# Patient Record
Sex: Male | Born: 1950 | Race: White | Hispanic: No | Smoking: Current every day smoker
Health system: Southern US, Community
[De-identification: ages and names within clinical notes are randomized; demographics above are authoritative.]

## PROBLEM LIST (undated history)

## (undated) DIAGNOSIS — J449 Chronic obstructive pulmonary disease, unspecified: Secondary | ICD-10-CM

## (undated) DIAGNOSIS — I2699 Other pulmonary embolism without acute cor pulmonale: Secondary | ICD-10-CM

## (undated) DIAGNOSIS — I10 Essential (primary) hypertension: Secondary | ICD-10-CM

## (undated) DIAGNOSIS — R0902 Hypoxemia: Secondary | ICD-10-CM

## (undated) DIAGNOSIS — I509 Heart failure, unspecified: Secondary | ICD-10-CM

## (undated) DIAGNOSIS — E669 Obesity, unspecified: Secondary | ICD-10-CM

## (undated) DIAGNOSIS — E785 Hyperlipidemia, unspecified: Secondary | ICD-10-CM

## (undated) DIAGNOSIS — J189 Pneumonia, unspecified organism: Secondary | ICD-10-CM

## (undated) DIAGNOSIS — I2510 Atherosclerotic heart disease of native coronary artery without angina pectoris: Secondary | ICD-10-CM

## (undated) DIAGNOSIS — M1990 Unspecified osteoarthritis, unspecified site: Secondary | ICD-10-CM

## (undated) DIAGNOSIS — Z86711 Personal history of pulmonary embolism: Secondary | ICD-10-CM

## (undated) DIAGNOSIS — I255 Ischemic cardiomyopathy: Secondary | ICD-10-CM

## (undated) DIAGNOSIS — K219 Gastro-esophageal reflux disease without esophagitis: Secondary | ICD-10-CM

## (undated) DIAGNOSIS — I219 Acute myocardial infarction, unspecified: Secondary | ICD-10-CM

## (undated) HISTORY — PX: CHOLECYSTECTOMY: SHX55

## (undated) HISTORY — DX: Hypoxemia: R09.02

## (undated) HISTORY — DX: Personal history of pulmonary embolism: Z86.711

## (undated) HISTORY — PX: ANKLE SURGERY: SHX546

## (undated) HISTORY — DX: Chronic obstructive pulmonary disease, unspecified: J44.9

## (undated) HISTORY — DX: Other pulmonary embolism without acute cor pulmonale: I26.99

## (undated) HISTORY — DX: Hyperlipidemia, unspecified: E78.5

## (undated) HISTORY — DX: Atherosclerotic heart disease of native coronary artery without angina pectoris: I25.10

## (undated) HISTORY — PX: APPENDECTOMY: SHX54

## (undated) HISTORY — DX: Obesity, unspecified: E66.9

## (undated) HISTORY — DX: Essential (primary) hypertension: I10

## (undated) HISTORY — DX: Ischemic cardiomyopathy: I25.5

## (undated) HISTORY — DX: Heart failure, unspecified: I50.9

## (undated) HISTORY — DX: Acute myocardial infarction, unspecified: I21.9

## (undated) HISTORY — DX: Gastro-esophageal reflux disease without esophagitis: K21.9

## (undated) HISTORY — DX: Unspecified osteoarthritis, unspecified site: M19.90

---

## 2003-03-24 DIAGNOSIS — I219 Acute myocardial infarction, unspecified: Secondary | ICD-10-CM

## 2003-03-24 HISTORY — DX: Acute myocardial infarction, unspecified: I21.9

## 2015-03-24 DIAGNOSIS — Z86711 Personal history of pulmonary embolism: Secondary | ICD-10-CM

## 2015-03-24 HISTORY — DX: Personal history of pulmonary embolism: Z86.711

## 2015-03-27 HISTORY — PX: CARDIAC CATHETERIZATION: SHX172

## 2015-03-29 HISTORY — PX: CORONARY ARTERY BYPASS GRAFT: SHX141

## 2015-03-31 DIAGNOSIS — E669 Obesity, unspecified: Secondary | ICD-10-CM | POA: Insufficient documentation

## 2015-04-10 DIAGNOSIS — I2699 Other pulmonary embolism without acute cor pulmonale: Secondary | ICD-10-CM

## 2015-04-10 HISTORY — DX: Other pulmonary embolism without acute cor pulmonale: I26.99

## 2015-08-01 DIAGNOSIS — E782 Mixed hyperlipidemia: Secondary | ICD-10-CM | POA: Insufficient documentation

## 2015-08-01 DIAGNOSIS — Z951 Presence of aortocoronary bypass graft: Secondary | ICD-10-CM | POA: Insufficient documentation

## 2016-05-21 DIAGNOSIS — I2510 Atherosclerotic heart disease of native coronary artery without angina pectoris: Secondary | ICD-10-CM | POA: Insufficient documentation

## 2016-05-21 DIAGNOSIS — I252 Old myocardial infarction: Secondary | ICD-10-CM | POA: Insufficient documentation

## 2016-05-22 DIAGNOSIS — I2510 Atherosclerotic heart disease of native coronary artery without angina pectoris: Secondary | ICD-10-CM | POA: Diagnosis not present

## 2016-05-22 DIAGNOSIS — E782 Mixed hyperlipidemia: Secondary | ICD-10-CM | POA: Diagnosis not present

## 2016-05-22 DIAGNOSIS — Z951 Presence of aortocoronary bypass graft: Secondary | ICD-10-CM | POA: Diagnosis not present

## 2016-05-22 DIAGNOSIS — I739 Peripheral vascular disease, unspecified: Secondary | ICD-10-CM | POA: Diagnosis not present

## 2016-05-22 DIAGNOSIS — I252 Old myocardial infarction: Secondary | ICD-10-CM | POA: Diagnosis not present

## 2016-05-22 DIAGNOSIS — I2782 Chronic pulmonary embolism: Secondary | ICD-10-CM | POA: Diagnosis not present

## 2016-05-22 DIAGNOSIS — I2699 Other pulmonary embolism without acute cor pulmonale: Secondary | ICD-10-CM | POA: Diagnosis not present

## 2016-06-16 DIAGNOSIS — R918 Other nonspecific abnormal finding of lung field: Secondary | ICD-10-CM | POA: Diagnosis not present

## 2016-06-16 DIAGNOSIS — I2699 Other pulmonary embolism without acute cor pulmonale: Secondary | ICD-10-CM | POA: Diagnosis not present

## 2016-06-16 DIAGNOSIS — I739 Peripheral vascular disease, unspecified: Secondary | ICD-10-CM | POA: Diagnosis not present

## 2016-08-11 DIAGNOSIS — R739 Hyperglycemia, unspecified: Secondary | ICD-10-CM | POA: Diagnosis not present

## 2016-08-11 DIAGNOSIS — E782 Mixed hyperlipidemia: Secondary | ICD-10-CM | POA: Diagnosis not present

## 2016-08-11 DIAGNOSIS — I2510 Atherosclerotic heart disease of native coronary artery without angina pectoris: Secondary | ICD-10-CM | POA: Diagnosis not present

## 2016-12-03 DIAGNOSIS — I252 Old myocardial infarction: Secondary | ICD-10-CM | POA: Diagnosis not present

## 2016-12-03 DIAGNOSIS — Z951 Presence of aortocoronary bypass graft: Secondary | ICD-10-CM | POA: Diagnosis not present

## 2016-12-03 DIAGNOSIS — I25110 Atherosclerotic heart disease of native coronary artery with unstable angina pectoris: Secondary | ICD-10-CM | POA: Diagnosis not present

## 2016-12-03 DIAGNOSIS — E782 Mixed hyperlipidemia: Secondary | ICD-10-CM | POA: Diagnosis not present

## 2016-12-03 DIAGNOSIS — I2782 Chronic pulmonary embolism: Secondary | ICD-10-CM | POA: Diagnosis not present

## 2016-12-03 DIAGNOSIS — I2510 Atherosclerotic heart disease of native coronary artery without angina pectoris: Secondary | ICD-10-CM | POA: Diagnosis not present

## 2017-03-03 ENCOUNTER — Other Ambulatory Visit: Payer: Self-pay | Admitting: *Deleted

## 2017-03-03 ENCOUNTER — Encounter: Payer: Self-pay | Admitting: *Deleted

## 2017-03-04 ENCOUNTER — Encounter: Payer: Self-pay | Admitting: Cardiovascular Disease

## 2017-03-04 ENCOUNTER — Ambulatory Visit (INDEPENDENT_AMBULATORY_CARE_PROVIDER_SITE_OTHER): Payer: Medicare Other | Admitting: Cardiovascular Disease

## 2017-03-04 ENCOUNTER — Other Ambulatory Visit: Payer: Self-pay

## 2017-03-04 VITALS — BP 116/74 | HR 65 | Ht 66.0 in | Wt 255.0 lb

## 2017-03-04 DIAGNOSIS — Z720 Tobacco use: Secondary | ICD-10-CM

## 2017-03-04 DIAGNOSIS — Z86711 Personal history of pulmonary embolism: Secondary | ICD-10-CM | POA: Diagnosis not present

## 2017-03-04 DIAGNOSIS — E785 Hyperlipidemia, unspecified: Secondary | ICD-10-CM | POA: Diagnosis not present

## 2017-03-04 DIAGNOSIS — I25708 Atherosclerosis of coronary artery bypass graft(s), unspecified, with other forms of angina pectoris: Secondary | ICD-10-CM | POA: Diagnosis not present

## 2017-03-04 DIAGNOSIS — I10 Essential (primary) hypertension: Secondary | ICD-10-CM | POA: Diagnosis not present

## 2017-03-04 MED ORDER — NITROGLYCERIN 0.4 MG SL SUBL: 0.4000 mg | SUBLINGUAL_TABLET | SUBLINGUAL | 3 refills | Status: AC | PRN

## 2017-03-04 NOTE — Progress Notes (Signed)
CARDIOLOGY CONSULT NOTE  Patient ID: AVIRAJ KENTNER MRN: [id number] DOB/AGE: 1950/03/28 66 y.o.  Admit date: (Not on file) Primary Physician: Patient, No Pcp Per Referring Physician: Dr. Jac Canavan  Reason for Consultation: Coronary artery disease  HPI: Jose Keith is a 66 y.o. male who is being seen today for the evaluation of coronary artery disease at the request of Dr. Jac Canavan.  He has a history of coronary artery bypass graft surgery in January 2017 with a LIMA to the LAD, SVG to the obtuse marginal branch and PDA.  Additional past medical history includes diabetes mellitus, dyslipidemia, obesity, pulmonary embolism and genera 2017, and history of old myocardial infarction in 2005.  He has a history of tobacco abuse.  I reviewed all relevant documentation, labs, and studies from his prior cardiologist.  Echocardiogram in January 2017 demonstrated mildly reduced left ventricular systolic function, LVEF 41-66%, with distal septal and apical dyskinesis.  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  Lipids 09/10/16: Total cholesterol 120, HDL 32, LDL 60, triglycerides 140.  He has been maintained on aspirin, Lipitor, and carvedilol.  Labs 10/11/16: BUN 14, creatinine 0.87, sodium 139, potassium 4.9.  ECG performed in the office today which I personally interpreted demonstrated sinus rhythm with right bundle branch block, left anterior fascicular block, and old anterolateral infarct.  He is feeling well and denies chest pain.  He has chronic exertional dyspnea which has not gotten any worse which he attributes to long-term tobacco use.  He has smoked three quarters of a pack of cigarettes daily since the age of 52.  He has some right knee arthritis.  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   He denies exertional leg pain.  He is here with his  daughter, Earnest Bailey, who lives with him.  He moved to [name] from [place] recently.  He used to be a Administrator, [employer] in [place].   No Known Allergies  Current Outpatient Medications  Medication Sig Dispense Refill  . Acetaminophen 325 MG CAPS Take by mouth as needed.    Marland Kitchen aspirin EC 325 MG tablet Take 1 tablet by mouth daily.    Marland Kitchen atorvastatin (LIPITOR) 40 MG tablet Take 1 tablet by mouth daily.    . carvedilol (COREG) 6.25 MG tablet Take 1 tablet by mouth 2 (two) times daily.    . ranitidine (ZANTAC) 150 MG tablet Take 150 mg by mouth 2 (two) times daily.     No current facility-administered medications for this visit.     Past Medical History:  Diagnosis Date  . CAD (coronary artery disease)   . Diabetes mellitus ([place])   . Heart attack ([place]) 2005   Old MI reported 2005  . History of pulmonary embolism 03/2015  . Hyperlipidemia   . Ischemic cardiomyopathy   . Obesity     Past Surgical History:  Procedure Laterality Date  . ANKLE SURGERY Left   . APPENDECTOMY    . CARDIAC CATHETERIZATION  03/27/2015  . CHOLECYSTECTOMY    . CORONARY ARTERY BYPASS GRAFT  03/29/2015   Coronary bypass surgery with LIMA-LAD, SVG-obutse marginal branch/PDA    Social History   Socioeconomic History  . Marital status: Divorced    Spouse name: Not on file  . Number of children: Not on file  . Years of education: Not on file  . Highest education level: Not on  file  Social Needs  . Financial resource strain: Not on file  . Food insecurity - worry: Not on file  . Food insecurity - inability: Not on file  . Transportation needs - medical: Not on file  . Transportation needs - non-medical: Not on file  Occupational History  . Not on file  Tobacco Use  . Smoking status: Current Every Day Smoker    Packs/day: 0.75    Types: Cigarettes  . Smokeless tobacco: Never Used  Substance and Sexual Activity  . Alcohol use: Not on file  . Drug use: Not on file  . Sexual activity: Not on file  Other  Topics Concern  . Not on file  Social History Narrative  . Not on file     No family history of premature CAD in 1st degree relatives.  Current Meds  Medication Sig  . Acetaminophen 325 MG CAPS Take by mouth as needed.  Marland Kitchen aspirin EC 325 MG tablet Take 1 tablet by mouth daily.  Marland Kitchen atorvastatin (LIPITOR) 40 MG tablet Take 1 tablet by mouth daily.  . carvedilol (COREG) 6.25 MG tablet Take 1 tablet by mouth 2 (two) times daily.  . ranitidine (ZANTAC) 150 MG tablet Take 150 mg by mouth 2 (two) times daily.  . [DISCONTINUED] omeprazole (PRILOSEC) 40 MG capsule Take 1 capsule by mouth daily.      Review of systems complete and found to be negative unless listed above in HPI    Physical exam Blood pressure 116/74, pulse 65, height 5\' 6"  (1.676 m), weight 255 lb (115.7 kg), SpO2 96 %. General: NAD Neck: No JVD, no thyromegaly or thyroid nodule.  Lungs: Clear to auscultation bilaterally with normal respiratory effort. CV: Nondisplaced PMI. Regular rate and rhythm, normal S1/S2, no S3/S4, no murmur.  No peripheral edema.  No carotid bruit.    Abdomen: Soft, nontender, no distention.  Skin: Intact without lesions or rashes.  Neurologic: Alert and oriented x 3.  Psych: Normal affect. Extremities: No clubbing or cyanosis.  HEENT: Normal.   ECG: Most recent ECG reviewed.   Labs: No results found for: K, BUN, CREATININE, ALT, TSH, HGB   Lipids: No results found for: LDLCALC, LDLDIRECT, CHOL, TRIG, HDL      ASSESSMENT AND PLAN:  1.  Coronary artery disease with history of CABG: Symptomatically stable.  Continue aspirin, carvedilol, and Lipitor.  I will prescribe nitroglycerin to be used as needed.  2.  Hypertension: Blood pressure is controlled.  No changes to therapy.  3.  Hyperlipidemia: Lipids reviewed above.  Continue statin therapy.  4.  Tobacco abuse disorder.  5.  History of pulmonary embolism: No longer on any coagulation.  He has been on Xarelto for 1 year.  He was  advised to take aspirin 325 mg daily at the time of hospital discharge.    Disposition: Follow up in 6 months  Signed: Kate Sable, M.D., F.A.C.C.  03/04/2017, 10:36 AM

## 2017-03-04 NOTE — Patient Instructions (Signed)

## 2017-03-29 ENCOUNTER — Ambulatory Visit: Payer: Medicare Other | Admitting: Family Medicine

## 2017-04-01 ENCOUNTER — Other Ambulatory Visit: Payer: Self-pay

## 2017-04-01 ENCOUNTER — Ambulatory Visit (INDEPENDENT_AMBULATORY_CARE_PROVIDER_SITE_OTHER): Payer: Medicare Other | Admitting: Family Medicine

## 2017-04-01 ENCOUNTER — Encounter: Payer: Self-pay | Admitting: Family Medicine

## 2017-04-01 VITALS — BP 138/76 | HR 60 | Temp 98.8°F | Resp 20 | Ht 66.0 in | Wt 251.0 lb

## 2017-04-01 DIAGNOSIS — K219 Gastro-esophageal reflux disease without esophagitis: Secondary | ICD-10-CM | POA: Diagnosis not present

## 2017-04-01 DIAGNOSIS — R351 Nocturia: Secondary | ICD-10-CM | POA: Diagnosis not present

## 2017-04-01 DIAGNOSIS — Z125 Encounter for screening for malignant neoplasm of prostate: Secondary | ICD-10-CM | POA: Diagnosis not present

## 2017-04-01 DIAGNOSIS — Z800 Family history of malignant neoplasm of digestive organs: Secondary | ICD-10-CM | POA: Insufficient documentation

## 2017-04-01 DIAGNOSIS — I2699 Other pulmonary embolism without acute cor pulmonale: Secondary | ICD-10-CM

## 2017-04-01 DIAGNOSIS — Z23 Encounter for immunization: Secondary | ICD-10-CM

## 2017-04-01 DIAGNOSIS — I10 Essential (primary) hypertension: Secondary | ICD-10-CM | POA: Diagnosis not present

## 2017-04-01 DIAGNOSIS — Z1159 Encounter for screening for other viral diseases: Secondary | ICD-10-CM | POA: Diagnosis not present

## 2017-04-01 DIAGNOSIS — E559 Vitamin D deficiency, unspecified: Secondary | ICD-10-CM | POA: Diagnosis not present

## 2017-04-01 DIAGNOSIS — Z720 Tobacco use: Secondary | ICD-10-CM | POA: Diagnosis not present

## 2017-04-01 DIAGNOSIS — E785 Hyperlipidemia, unspecified: Secondary | ICD-10-CM

## 2017-04-01 DIAGNOSIS — N401 Enlarged prostate with lower urinary tract symptoms: Secondary | ICD-10-CM | POA: Diagnosis not present

## 2017-04-01 DIAGNOSIS — I2510 Atherosclerotic heart disease of native coronary artery without angina pectoris: Secondary | ICD-10-CM

## 2017-04-01 NOTE — Patient Instructions (Addendum)
Walk every day that you are able Please give serious consideration to stopping smoking Continue current medicines Need lab tests I recommend immunizations Come back for PE     Preventive Care 65 Years and Older, Male Preventive care refers to lifestyle choices and visits with your health care provider that can promote health and wellness. What does preventive care include?  A yearly physical exam. This is also called an annual well check.  Dental exams once or twice a year.  Routine eye exams. Ask your health care provider how often you should have your eyes checked.  Personal lifestyle choices, including: ? Daily care of your teeth and gums. ? Regular physical activity. ? Eating a healthy diet. ? Avoiding tobacco and drug use. ? Limiting alcohol use. ? Practicing safe sex. ? Taking low doses of aspirin every day. ? Taking vitamin and mineral supplements as recommended by your health care provider. What happens during an annual well check? The services and screenings done by your health care provider during your annual well check will depend on your age, overall health, lifestyle risk factors, and family history of disease. Counseling Your health care provider may ask you questions about your:  Alcohol use.  Tobacco use.  Drug use.  Emotional well-being.  Home and relationship well-being.  Sexual activity.  Eating habits.  History of falls.  Memory and ability to understand (cognition).  Work and work Statistician.  Screening You may have the following tests or measurements:  Height, weight, and BMI.  Blood pressure.  Lipid and cholesterol levels. These may be checked every 5 years, or more frequently if you are over 64 years old.  Skin check.  Lung cancer screening. You may have this screening every year starting at age 43 if you have a 30-pack-year history of smoking and currently smoke or have quit within the past 15 years..  Flexible sigmoidoscopy  or colonoscopy. You may have a sigmoidoscopy every 5 years or a colonoscopy every 10 years starting at age 24. An alterative is the cologuard home test  Prostate cancer screening. Recommendations will vary depending on your family history and other risks.  Hepatitis C blood test.  Diabetes screening. This is done by checking your blood sugar (glucose) after you have not eaten for a while (fasting). You may have this done every 1-3 years.  Abdominal aortic aneurysm (AAA) screening. You may need this if you are a current or former smoker.   Talk with your health care provider about your test results, treatment options, and if necessary, the need for more tests. Vaccines Your health care provider may recommend certain vaccines, such as:  Influenza vaccine. This is recommended every year.  Tetanus, diphtheria, and acellular pertussis (Tdap, Td) vaccine. You may need a Td booster every 10 years.  Varicella vaccine. You may need this if you have not been vaccinated.  Zoster vaccine. You may need this after age 64.  also need a second dose.  Pneumococcal 13-valent conjugate (PCV13) vaccine. One dose is recommended after age 63.  Pneumococcal polysaccharide (PPSV23) vaccine. One dose is recommended after age 47.

## 2017-04-01 NOTE — Progress Notes (Signed)
Chief Complaint  Patient presents with  . Hypertension   67 year old new patient here with his daughter to establish for primary care.  He states he has "never" had a primary care doctor.  He is under the care of cardiology for known coronary artery disease, status post CABG, hypertension, hyperlipidemia, tobacco abuse. He has not up-to-date with a preventative health care.  No recall of immunizations.  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  Refuses flu shot, pneumonia shot, and shingles shot.  The reasons behind benefits and risks of all the shots were discussed with him today. He is never had a colonoscopy.  His brother is currently dying of colorectal cancer.  He is 67 years of age.  I explained that he is at increased risk of colon cancer because of his tobacco abuse and his family history.  He states he does not want a colonoscopy. He also is due for screening for abdominal aortic aneurysm. He has not had a physical examination in many years. He has not had an eye examination many years. He no longer sees a dentist, has no teeth.  He states he pulled most of his own teeth out He gets no regular exercise. He has a history of COPD.  He has dyspnea with exertion. He denies any angina.  He states he has nitro, he carries it with him, he has never used it. I have discussed the multiple health risks associated with cigarette smoking including, but not limited to, cardiovascular disease, lung disease and cancer.  I have strongly recommended that smoking be stopped.  I have reviewed the various methods of quitting including cold [place], classes, nicotine replacements and prescription medications.  I have offered assistance in this difficult process.  The patient is not interested in assistance at this time. His only complaint is left knee pain.  His right knee bothers him with exertion.  He thinks he has arthritis.  He takes over-the-counter Tylenol or Aleve for his knee pain.  No  trauma.  No buckling or instability.  No falls.   Patient Active Problem List   Diagnosis Date Noted  . BPH associated with nocturia 04/01/2017  . Essential hypertension 04/01/2017  . Tobacco abuse 04/01/2017  . Family history of colon cancer 04/01/2017  . GERD without esophagitis 04/01/2017  . Coronary artery disease involving native coronary artery of native heart without angina pectoris 05/21/2016  . Old MI (myocardial infarction) 05/21/2016  . Mixed hyperlipidemia 08/01/2015  . S/P CABG x 3 08/01/2015  . Obesity (BMI 30-39.9) 03/31/2015    Outpatient Encounter Medications as of 04/01/2017  Medication Sig  . Acetaminophen 325 MG CAPS Take by mouth as needed.  Marland Kitchen aspirin EC 325 MG tablet Take 1 tablet by mouth daily.  Marland Kitchen atorvastatin (LIPITOR) 40 MG tablet Take 1 tablet by mouth daily.  . carvedilol (COREG) 6.25 MG tablet Take 1 tablet by mouth 2 (two) times daily.  . naproxen sodium (ALEVE) 220 MG tablet Take 220 mg by mouth 2 (two) times daily as needed.  . nitroGLYCERIN (NITROSTAT) 0.4 MG SL tablet Place 1 tablet (0.4 mg total) under the tongue every 5 (five) minutes as needed for chest pain.  . ranitidine (ZANTAC) 150 MG tablet Take 150 mg by mouth at bedtime.    No facility-administered encounter medications on file as of 04/01/2017.     Past Medical History:  Diagnosis Date  . Arthritis    knee  . CAD (coronary artery disease)   .  CHF (congestive heart failure) ([place])   . COPD (chronic obstructive pulmonary disease) ([place])   . GERD (gastroesophageal reflux disease)   . Heart attack ([place]) 2005   Old MI reported 2005  . History of pulmonary embolism 03/2015  . Hyperlipidemia   . Hypertension   . Ischemic cardiomyopathy   . Obesity   . Oxygen deficiency   . Pulmonary embolism ([place]) 04/10/2015    Past Surgical History:  Procedure Laterality Date  . ANKLE SURGERY Left   . APPENDECTOMY    . CARDIAC CATHETERIZATION  03/27/2015  . CHOLECYSTECTOMY    . CORONARY ARTERY  BYPASS GRAFT  03/29/2015   Coronary bypass surgery with LIMA-LAD, SVG-obutse marginal branch/PDA    Social History   Socioeconomic History  . Marital status: Divorced    Spouse name: Not on file  . Number of children: 2  . Years of education: 10  . Highest education level: 10th grade  Social Needs  . Financial resource strain: Not on file  . Food insecurity - worry: Not on file  . Food insecurity - inability: Not on file  . Transportation needs - medical: Not on file  . Transportation needs - non-medical: Not on file  Occupational History  . Occupation: Administrator, [employer]   retired  Tobacco Use  . Smoking status: Current Every Day Smoker    Packs/day: 0.75    Types: Cigarettes    Start date: 03/23/1966  . Smokeless tobacco: Never Used  Substance and Sexual Activity  . Alcohol use: No    Frequency: Never  . Drug use: No  . Sexual activity: No  Other Topics Concern  . Not on file  Social History Narrative   Lives with Daughter Earnest Bailey   Pets   3 dogs   9 birds   No reg exercise    Family History  Problem Relation Age of Onset  . Stroke Mother   . Depression Mother   . Stroke Father   . Stroke Brother   . Heart attack Brother   . Cancer Brother        rectal/colon  . Asthma Son   . Stroke Brother     Review of Systems  Constitutional: Negative for chills, fever and weight loss.  HENT: Positive for hearing loss. Negative for congestion.        States he cannot hear out of right ear  Eyes: Negative for blurred vision and pain.  Respiratory: Positive for shortness of breath. Negative for cough.   Cardiovascular: Negative for chest pain and leg swelling.  Gastrointestinal: Negative for abdominal pain, constipation, diarrhea and heartburn.  Genitourinary: Negative for dysuria and frequency.  Musculoskeletal: Positive for joint pain. Negative for falls and myalgias.  Neurological: Negative for dizziness, seizures and headaches.  Psychiatric/Behavioral: Negative for  depression. The patient is not nervous/anxious and does not have insomnia.     BP 138/76 (BP Location: Right Arm, Patient Position: Sitting, Cuff Size: Large)   Pulse 60   Temp 98.8 F (37.1 C) (Temporal)   Resp 20   Ht 5\' 6"  (1.676 m)   Wt 251 lb 0.6 oz (113.9 kg)   SpO2 93%   BMI 40.52 kg/m   Physical Exam  Constitutional: He is oriented to person, place, and time. He appears well-developed and well-nourished.  HENT:  Head: Normocephalic and atraumatic.  Mouth/Throat: Oropharynx is clear and moist.  Edentulous  Eyes: Conjunctivae are normal. Pupils are equal, round, and reactive to light.  Neck: Normal range of  motion. Neck supple. No thyromegaly present.  Cardiovascular: Normal rate, regular rhythm and normal heart sounds.  Pulmonary/Chest: Effort normal and breath sounds normal. No respiratory distress.  Abdominal: Soft. Bowel sounds are normal.  Obese abdomen  Musculoskeletal: Normal range of motion. He exhibits no edema.  Crepitus and warmth right knee  Lymphadenopathy:    He has no cervical adenopathy.  Neurological: He is alert and oriented to person, place, and time.  Gait minimally antalgic  Skin: Skin is warm and dry.  Psychiatric: He has a normal mood and affect. His behavior is normal. Thought content normal.  Nursing note and vitals reviewed.  ASSESSMENT/PLAN:  1. Coronary artery disease involving native heart without angina pectoris, unspecified vessel or lesion type Under care of cardiology.  Denies angina - CBC - COMPLETE METABOLIC PANEL WITH GFR - Urinalysis, Routine w reflex microscopic  2. Essential hypertension Controlled  3. Hyperlipidemia, unspecified hyperlipidemia type Compliant with statin, needs lipid panel - Lipid panel  4. Tobacco abuse Unwilling to quit  5. Encounter for hepatitis C screening test for low risk patient Discussed - Hepatitis C antibody  6. Screening for prostate cancer Patient desires blood test for prostate  cancer.  Discussed risks and benefits. - PSA  7. Family history of colon cancer Refuses colonoscopy  8. Vitamin D deficiency By history - VITAMIN D 25 Hydroxy (Vit-D Deficiency, Fractures)  9. BPH associated with nocturia Nocturia x2 - PSA  10. Need for influenza vaccination Refuses - Flu Vaccine QUAD 36+ mos IM  11. Other pulmonary embolism without acute cor pulmonale, unspecified chronicity ([place]) History of  12. GERD without esophagitis Controlled with Zantac   Patient Instructions  Walk every day that you are able Please give serious consideration to stopping smoking Continue current medicines Need lab tests I recommend immunizations Come back for PE     Preventive Care 65 Years and Older, Male Preventive care refers to lifestyle choices and visits with your health care provider that can promote health and wellness. What does preventive care include?  A yearly physical exam. This is also called an annual well check.  Dental exams once or twice a year.  Routine eye exams. Ask your health care provider how often you should have your eyes checked.  Personal lifestyle choices, including: ? Daily care of your teeth and gums. ? Regular physical activity. ? Eating a healthy diet. ? Avoiding tobacco and drug use. ? Limiting alcohol use. ? Practicing safe sex. ? Taking low doses of aspirin every day. ? Taking vitamin and mineral supplements as recommended by your health care provider. What happens during an annual well check? The services and screenings done by your health care provider during your annual well check will depend on your age, overall health, lifestyle risk factors, and family history of disease. Counseling Your health care provider may ask you questions about your:  Alcohol use.  Tobacco use.  Drug use.  Emotional well-being.  Home and relationship well-being.  Sexual activity.  Eating habits.  History of falls.  Memory and ability  to understand (cognition).  Work and work Statistician.  Screening You may have the following tests or measurements:  Height, weight, and BMI.  Blood pressure.  Lipid and cholesterol levels. These may be checked every 5 years, or more frequently if you are over 35 years old.  Skin check.  Lung cancer screening. You may have this screening every year starting at age 37 if you have a 30-pack-year history of smoking and currently  smoke or have quit within the past 15 years..  Flexible sigmoidoscopy or colonoscopy. You may have a sigmoidoscopy every 5 years or a colonoscopy every 10 years starting at age 55. An alterative is the cologuard home test  Prostate cancer screening. Recommendations will vary depending on your family history and other risks.  Hepatitis C blood test.  Diabetes screening. This is done by checking your blood sugar (glucose) after you have not eaten for a while (fasting). You may have this done every 1-3 years.  Abdominal aortic aneurysm (AAA) screening. You may need this if you are a current or former smoker.   Talk with your health care provider about your test results, treatment options, and if necessary, the need for more tests. Vaccines Your health care provider may recommend certain vaccines, such as:  Influenza vaccine. This is recommended every year.  Tetanus, diphtheria, and acellular pertussis (Tdap, Td) vaccine. You may need a Td booster every 10 years.  Varicella vaccine. You may need this if you have not been vaccinated.  Zoster vaccine. You may need this after age 47.  also need a second dose.  Pneumococcal 13-valent conjugate (PCV13) vaccine. One dose is recommended after age 56.  Pneumococcal polysaccharide (PPSV23) vaccine. One dose is recommended after age 87.         Raylene Everts, MD

## 2017-04-02 ENCOUNTER — Encounter: Payer: Self-pay | Admitting: Family Medicine

## 2017-04-02 LAB — LIPID PANEL
Cholesterol: 111 mg/dL (ref <200)
HDL: 29 mg/dL — ABNORMAL LOW (ref >40)
LDL Cholesterol (Calc): 59 mg/dL (calc)
Non-HDL Cholesterol (Calc): 82 mg/dL (calc) (ref <130)
Total CHOL/HDL Ratio: 3.8 (calc) (ref <5.0)
Triglycerides: 152 mg/dL — ABNORMAL HIGH (ref <150)

## 2017-04-02 LAB — URINALYSIS, ROUTINE W REFLEX MICROSCOPIC
Bilirubin Urine: NEGATIVE
Glucose, UA: NEGATIVE
Hgb urine dipstick: NEGATIVE
Ketones, ur: NEGATIVE
Leukocytes, UA: NEGATIVE
Nitrite: NEGATIVE
Protein, ur: NEGATIVE
Specific Gravity, Urine: 1.011 (ref 1.001–1.03)
pH: 6.5 (ref 5.0–8.0)

## 2017-04-02 LAB — COMPLETE METABOLIC PANEL WITH GFR
AG Ratio: 1.3 (calc) (ref 1.0–2.5)
ALT: 19 U/L (ref 9–46)
AST: 15 U/L (ref 10–35)
Albumin: 4.1 g/dL (ref 3.6–5.1)
Alkaline phosphatase (APISO): 94 U/L (ref 40–115)
BUN: 12 mg/dL (ref 7–25)
CO2: 30 mmol/L (ref 20–32)
Calcium: 9.5 mg/dL (ref 8.6–10.3)
Chloride: 103 mmol/L (ref 98–110)
Creat: 1.02 mg/dL (ref 0.70–1.25)
GFR, Est African American: 89 mL/min/{1.73_m2} (ref >=60)
GFR, Est Non African American: 77 mL/min/{1.73_m2} (ref >=60)
Globulin: 3.2 g/dL (calc) (ref 1.9–3.7)
Glucose, Bld: 100 mg/dL (ref 65–139)
Potassium: 5.7 mmol/L — ABNORMAL HIGH (ref 3.5–5.3)
Sodium: 138 mmol/L (ref 135–146)
Total Bilirubin: 0.5 mg/dL (ref 0.2–1.2)
Total Protein: 7.3 g/dL (ref 6.1–8.1)

## 2017-04-02 LAB — CBC
HCT: 46.8 % (ref 38.5–50.0)
Hemoglobin: 16 g/dL (ref 13.2–17.1)
MCH: 31.2 pg (ref 27.0–33.0)
MCHC: 34.2 g/dL (ref 32.0–36.0)
MCV: 91.2 fL (ref 80.0–100.0)
MPV: 11.4 fL (ref 7.5–12.5)
Platelets: 232 10*3/uL (ref 140–400)
RBC: 5.13 10*6/uL (ref 4.20–5.80)
RDW: 12.7 % (ref 11.0–15.0)
WBC: 6.8 10*3/uL (ref 3.8–10.8)

## 2017-04-02 LAB — HEPATITIS C ANTIBODY
Hepatitis C Ab: NONREACTIVE
SIGNAL TO CUT-OFF: 0.02 (ref <1.00)

## 2017-04-02 LAB — VITAMIN D 25 HYDROXY (VIT D DEFICIENCY, FRACTURES): Vit D, 25-Hydroxy: 17 ng/mL — ABNORMAL LOW (ref 30–100)

## 2017-04-02 LAB — PSA: PSA: 0.3 ng/mL (ref <=4.0)

## 2017-04-29 ENCOUNTER — Encounter: Payer: Medicare Other | Admitting: Family Medicine

## 2017-05-31 ENCOUNTER — Encounter: Payer: Self-pay | Admitting: Family Medicine

## 2017-10-14 ENCOUNTER — Other Ambulatory Visit: Payer: Self-pay | Admitting: Cardiovascular Disease

## 2017-10-14 MED ORDER — CARVEDILOL 6.25 MG PO TABS: 6.2500 mg | ORAL_TABLET | Freq: Two times a day (BID) | ORAL | 0 refills | Status: DC

## 2017-10-14 MED ORDER — ATORVASTATIN CALCIUM 40 MG PO TABS: 40.0000 mg | ORAL_TABLET | Freq: Every day | ORAL | 0 refills | Status: DC

## 2017-10-14 NOTE — Telephone Encounter (Signed)
°*  STAT* If patient is at the pharmacy, call can be transferred to refill team.   1. Which medications need to be refilled?  atorvastatin (LIPITOR) 40 MG tablet  carvedilol (COREG) 6.25 MG tablet       2. Which pharmacy/location (including street and city if local pharmacy) is medication to be sent to? [facility]   3. Do they need a 30 day or 90 day supply?

## 2017-10-14 NOTE — Telephone Encounter (Signed)
RX sent

## 2017-12-28 ENCOUNTER — Ambulatory Visit (INDEPENDENT_AMBULATORY_CARE_PROVIDER_SITE_OTHER): Payer: Medicare Other | Admitting: Cardiovascular Disease

## 2017-12-28 ENCOUNTER — Encounter: Payer: Self-pay | Admitting: Cardiovascular Disease

## 2017-12-28 VITALS — BP 128/74 | HR 53 | Ht 69.0 in | Wt 249.0 lb

## 2017-12-28 DIAGNOSIS — I25708 Atherosclerosis of coronary artery bypass graft(s), unspecified, with other forms of angina pectoris: Secondary | ICD-10-CM

## 2017-12-28 DIAGNOSIS — M545 Low back pain, unspecified: Secondary | ICD-10-CM

## 2017-12-28 DIAGNOSIS — I10 Essential (primary) hypertension: Secondary | ICD-10-CM | POA: Diagnosis not present

## 2017-12-28 DIAGNOSIS — R600 Localized edema: Secondary | ICD-10-CM

## 2017-12-28 DIAGNOSIS — E785 Hyperlipidemia, unspecified: Secondary | ICD-10-CM

## 2017-12-28 DIAGNOSIS — Z720 Tobacco use: Secondary | ICD-10-CM | POA: Diagnosis not present

## 2017-12-28 NOTE — Progress Notes (Signed)
SUBJECTIVE: The patient presents for routine follow-up.  He has a history of coronary artery bypass graft surgery in January 2017 with a LIMA to the LAD, SVG to the obtuse marginal branch and PDA.    Echocardiogram in January 2017 demonstrated mildly reduced left ventricular systolic function, LVEF 65-99%, with distal septal and apical dyskinesis.  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  He has chronic exertional dyspnea which is stable.  He has a long history of tobacco use.  He is here with his daughter, Earnest Bailey, who lives with him.  He denies chest pain.  His primary complaints relate to lower back pain which has been going on for the past 6 months as well as right leg swelling.  His leg has felt warm to the touch.  He subjectively denies fevers.    He has a prior history of pulmonary embolism.  If he is sitting he is okay but if he stands for some time, he develops lower back pain and then numbness and weakness of both legs.  He has not seen his PCP in at least a year.  His daughter says he does not like going to see doctors.   Social history: He moved to [place] from [place].  He used to be a Administrator, [employer] in [place].  He lives with his daughter, Earnest Bailey.   Review of Systems: As per "subjective", otherwise negative.  No Known Allergies  Current Outpatient Medications  Medication Sig Dispense Refill  . acetaminophen (TYLENOL) 500 MG tablet Take 500 mg by mouth every 6 (six) hours as needed.    Marland Kitchen aspirin EC 325 MG tablet Take 1 tablet by mouth daily.    Marland Kitchen atorvastatin (LIPITOR) 40 MG tablet Take 1 tablet (40 mg total) by mouth daily. 90 tablet 0  . carvedilol (COREG) 6.25 MG tablet Take 1 tablet (6.25 mg total) by mouth 2 (two) times daily. 180 tablet 0  . naproxen sodium (ALEVE) 220 MG tablet Take 220 mg by mouth 2 (two) times daily as needed.     . ranitidine (ZANTAC) 150 MG tablet Take 150 mg by mouth at bedtime.     . nitroGLYCERIN (NITROSTAT) 0.4 MG SL tablet Place 1 tablet (0.4 mg total) under the tongue every 5 (five) minutes as needed for chest pain. 25 tablet 3   No current facility-administered medications for this visit.     Past Medical History:  Diagnosis Date  . Arthritis    knee  . CAD (coronary artery disease)   . CHF (congestive heart failure) ([place])   . COPD (chronic obstructive pulmonary disease) ([place])   . GERD (gastroesophageal reflux disease)   . Heart attack ([place]) 2005   Old MI reported 2005  . History of pulmonary embolism 03/2015  . Hyperlipidemia   . Hypertension   . Ischemic cardiomyopathy   . Obesity   . Oxygen deficiency   . Pulmonary embolism ([place]) 04/10/2015    Past Surgical History:  Procedure Laterality Date  . ANKLE SURGERY Left   . APPENDECTOMY    . CARDIAC CATHETERIZATION  03/27/2015  . CHOLECYSTECTOMY    . CORONARY ARTERY BYPASS GRAFT  03/29/2015   Coronary bypass surgery with LIMA-LAD, SVG-obutse marginal branch/PDA    Social History   Socioeconomic History  . Marital status: Divorced    Spouse name: Not on file  . Number of children: 2  .  Years of education: 71  . Highest education level: 10th grade  Occupational History  . Occupation: Administrator, [employer] Service   retired  Scientific laboratory technician  . Financial resource strain: Not on file  . Food insecurity:    Worry: Not on file    Inability: Not on file  . Transportation needs:    Medical: Not on file    Non-medical: Not on file  Tobacco Use  . Smoking status: Current Every Day Smoker    Packs/day: 0.75    Types: Cigarettes    Start date: 03/23/1966  . Smokeless tobacco: Never Used  Substance and Sexual Activity  . Alcohol use: No    Frequency: Never  . Drug use: No  . Sexual activity: Never  Lifestyle  . Physical activity:    Days per week: Not on file    Minutes per session: Not on file  . Stress: Not on file  Relationships  .  Social connections:    Talks on phone: Not on file    Gets together: Not on file    Attends religious service: Not on file    Active member of club or organization: Not on file    Attends meetings of clubs or organizations: Not on file    Relationship status: Not on file  . Intimate partner violence:    Fear of current or ex partner: Not on file    Emotionally abused: Not on file    Physically abused: Not on file    Forced sexual activity: Not on file  Other Topics Concern  . Not on file  Social History Narrative   Lives with Daughter Earnest Bailey   Pets   3 dogs   9 birds   No reg exercise     Vitals:   12/28/17 1619  BP: 128/74  Pulse: (!) 53  SpO2: 95%  Weight: 249 lb (112.9 kg)  Height: 5\' 9"  (1.753 m)    Wt Readings from Last 3 Encounters:  12/28/17 249 lb (112.9 kg)  04/01/17 251 lb 0.6 oz (113.9 kg)  03/04/17 255 lb (115.7 kg)     PHYSICAL EXAM General: NAD HEENT: Normal. Neck: No JVD, no thyromegaly. Lungs: Clear to auscultation bilaterally with normal respiratory effort. CV: Regular rate and rhythm, normal S1/S2, no S3/S4, no murmur.  Right leg is swollen and warm without erythema.  No pitting edema.  No carotid bruit.   Abdomen: Soft, nontender, obese.  Neurologic: Alert and oriented.  Psych: Normal affect. Skin: Normal. Musculoskeletal: No gross deformities.  No point tenderness of the spine.    ECG: Reviewed above under Subjective   Labs: Lab Results  Component Value Date/Time   K 5.7 (H) 04/01/2017 09:32 AM   BUN 12 04/01/2017 09:32 AM   CREATININE 1.02 04/01/2017 09:32 AM   ALT 19 04/01/2017 09:32 AM   HGB 16.0 04/01/2017 09:32 AM     Lipids: Lab Results  Component Value Date/Time   LDLCALC 59 04/01/2017 09:32 AM   CHOL 111 04/01/2017 09:32 AM   TRIG 152 (H) 04/01/2017 09:32 AM   HDL 29 (L) 04/01/2017 09:32 AM       ASSESSMENT AND PLAN:  1.  Coronary artery disease with history of CABG: Symptomatically stable.  Continue aspirin,  carvedilol, and Lipitor.  2.  Hypertension: Blood pressure is controlled.  No changes to therapy.  3.  Hyperlipidemia: Lipids reviewed above.  Continue statin therapy.  4.  Tobacco abuse disorder.  5.  Right leg swelling and warmth: Given  his history of pulmonary embolism, DVT will need to be ruled out.  I will obtain lower externally Dopplers.  6.  Lower back pain with bilateral leg numbness and weakness: He very well may have spinal stenosis.  I will make an orthopedic surgery referral.  I will defer spine imaging to them.   Disposition: Follow up 6 months or sooner   Kate Sable, M.D., F.A.C.C.

## 2017-12-28 NOTE — Patient Instructions (Signed)
Medication Instructions:  Continue all current medications.  Labwork: none  Testing/Procedures:  Lower extremity doppler   Office will contact with results via phone or letter.    Follow-Up: Your physician wants you to follow up in: 6 months.  You will receive a reminder letter in the mail one-two months in advance.  If you don't receive a letter, please call our office to schedule the follow up appointment   Any Other Special Instructions Will Be Listed Below (If Applicable). You have been referred to:  Dr. Arther Abbott   If you need a refill on your cardiac medications before your next appointment, please call your pharmacy.

## 2017-12-29 ENCOUNTER — Other Ambulatory Visit: Payer: Self-pay | Admitting: *Deleted

## 2017-12-29 ENCOUNTER — Ambulatory Visit (HOSPITAL_COMMUNITY): Payer: Medicare Other

## 2017-12-29 ENCOUNTER — Telehealth: Payer: Self-pay | Admitting: Cardiovascular Disease

## 2017-12-29 DIAGNOSIS — R600 Localized edema: Secondary | ICD-10-CM

## 2017-12-29 NOTE — Telephone Encounter (Signed)
Pre-cert Verification for the following procedure   US VENOUS IMAG BI/LT/RT  Scheduled for 12-30-2017 at [facility]

## 2017-12-29 NOTE — Addendum Note (Signed)
Addended by: Laurine Blazer on: 12/29/2017 10:16 AM   Modules accepted: Orders

## 2017-12-30 ENCOUNTER — Ambulatory Visit (HOSPITAL_COMMUNITY)
Admission: RE | Admit: 2017-12-30 | Discharge: 2017-12-30 | Disposition: A | Discharge status: Home / Self Care | Payer: Medicare Other | Source: Ambulatory Visit | Attending: Cardiovascular Disease | Admitting: Cardiovascular Disease

## 2017-12-30 DIAGNOSIS — R600 Localized edema: Secondary | ICD-10-CM | POA: Diagnosis not present

## 2017-12-31 ENCOUNTER — Telehealth: Payer: Self-pay | Admitting: *Deleted

## 2017-12-31 NOTE — Telephone Encounter (Signed)
-----   Message from Herminio Commons, MD sent at 12/31/2017  8:36 AM EDT ----- This study demonstrates: No blood clots Medication changes / Follow up studies / Other recommendations:   None. Please send results to the PCP:  Patient, No Pcp Per   Kate Sable, MD 12/31/2017 8:36 AM

## 2017-12-31 NOTE — Telephone Encounter (Signed)
Patient informed. 

## 2018-01-17 ENCOUNTER — Ambulatory Visit (INDEPENDENT_AMBULATORY_CARE_PROVIDER_SITE_OTHER): Payer: Medicare Other | Admitting: Orthopedic Surgery

## 2018-01-17 ENCOUNTER — Ambulatory Visit (INDEPENDENT_AMBULATORY_CARE_PROVIDER_SITE_OTHER): Payer: Medicare Other

## 2018-01-17 ENCOUNTER — Encounter: Payer: Self-pay | Admitting: Orthopedic Surgery

## 2018-01-17 ENCOUNTER — Telehealth: Payer: Self-pay | Admitting: Radiology

## 2018-01-17 VITALS — BP 144/75 | HR 58 | Ht 68.0 in | Wt 239.0 lb

## 2018-01-17 DIAGNOSIS — M79606 Pain in leg, unspecified: Secondary | ICD-10-CM

## 2018-01-17 DIAGNOSIS — M48062 Spinal stenosis, lumbar region with neurogenic claudication: Secondary | ICD-10-CM | POA: Diagnosis not present

## 2018-01-17 DIAGNOSIS — M5441 Lumbago with sciatica, right side: Secondary | ICD-10-CM | POA: Diagnosis not present

## 2018-01-17 DIAGNOSIS — I25708 Atherosclerosis of coronary artery bypass graft(s), unspecified, with other forms of angina pectoris: Secondary | ICD-10-CM

## 2018-01-17 DIAGNOSIS — R209 Unspecified disturbances of skin sensation: Secondary | ICD-10-CM | POA: Diagnosis not present

## 2018-01-17 DIAGNOSIS — F17210 Nicotine dependence, cigarettes, uncomplicated: Secondary | ICD-10-CM

## 2018-01-17 DIAGNOSIS — M5442 Lumbago with sciatica, left side: Secondary | ICD-10-CM

## 2018-01-17 NOTE — Telephone Encounter (Signed)
Called to advise daughter of the scans 01/20/18 arrive at 1:15 for [place] then MRI will follow at 3pm.

## 2018-01-17 NOTE — Patient Instructions (Signed)
Spinal Stenosis Spinal stenosis happens when the open space (spinal canal) between the bones of your spine (vertebrae) gets smaller. It is caused by bone pushing into the open spaces of your backbone (spine). This puts pressure on your backbone and the nerves in your backbone. Treatment often focuses on managing any pain and symptoms. In some cases, surgery may be needed. Follow these instructions at home: Managing pain, stiffness, and swelling  Do all exercises and stretches as told by your doctor.  Stand and sit up straight (use good posture). If you were given a brace or a corset, wear it as told by your doctor.  Do not do any activities that cause pain. Ask your doctor what activities are safe for you.  Do not lift anything that is heavier than 10 lb (4.5 kg) or heavier than your doctor tells you.  Try to stay at a healthy weight. Talk with your doctor if you need help losing weight.  If directed, put heat on the affected area as often as told by your doctor. Use the heat source that your doctor recommends, such as a moist heat pack or a heating pad. ? Put a towel between your skin and the heat source. ? Leave the heat on for 20-30 minutes. ? Remove the heat if your skin turns bright red. This is especially important if you are not able to feel pain, heat, or cold. You may have a greater risk of getting burned. General instructions  Take over-the-counter and prescription medicines only as told by your doctor.  Do not use any products that contain nicotine or tobacco, such as cigarettes and e-cigarettes. If you need help quitting, ask your doctor.  Eat a healthy diet. This includes plenty of fruits and vegetables, whole grains, and low-fat (lean) protein.  Keep all follow-up visits as told by your doctor. This is important. Contact a doctor if:  Your symptoms do not get better.  Your symptoms get worse.  You have a fever. Get help right away if:  You have new or worse pain in  your neck or upper back.  You have very bad pain that medicine does not control.  You are dizzy.  You have vision problems, blurred vision, or double vision.  You have a very bad headache that is worse when you stand.  You feel sick to your stomach (nauseous).  You throw up (vomit).  You have new or worse numbness or tingling in your back or legs.  You have pain, redness, swelling, or warmth in your arm or leg. Summary  Spinal stenosis happens when the open space (spinal canal) between the bones of your spine gets smaller (narrow).  Contact a doctor if your symptoms get worse.  In some cases, surgery may be needed. This information is not intended to replace advice given to you by your health care provider. Make sure you discuss any questions you have with your health care provider. Document Released: 07/03/2010 Document Revised: 02/12/2016 Document Reviewed: 02/12/2016 Elsevier Interactive Patient Education  2017 Elsevier Inc.  

## 2018-01-17 NOTE — Progress Notes (Signed)
Jose Keith  01/17/2018  HISTORY SECTION :  Chief Complaint  Patient presents with  . Back Pain    lumbar down both legs    HPI The patient presents for evaluation of lower back pain and bilateral leg pain  He is 67 years old has coronary artery disease his cardiologist to send him here for evaluation for bilateral leg pain associated with poor back pain which is had for over 1-1/2 years no prior treatment.  Currently on Tylenol and naproxen.  He complains of episodic lower back pain which is associated with bilateral leg pain and weakness when it hits.  He complains of a history of a draining cyst in his back which seems to come and go.  He says he feels like he has an infection in his back.  He seems to do better sitting than walking.  He says he walks 100 yards and then his legs feel like they are going to give out or collapse.  He had a DVT study which was negative.  He says his feet still cool all the time.  He is a smoker.  He says he does want to quit but seems to have difficulty maintaining a smoking cessation program   Pain descriptors Quality dull Severity 10    Review of Systems  Constitutional: Negative for weight loss.  HENT: Positive for hearing loss.   Respiratory: Positive for shortness of breath.   Cardiovascular: Negative for chest pain.  Musculoskeletal: Positive for back pain and joint pain.       Pain popping clicking right knee  Neurological: Positive for weakness. Negative for tingling and sensory change.  Psychiatric/Behavioral: Nervous/anxious: .famh.   All other systems reviewed and are negative.   Past Medical History:  Diagnosis Date  . Arthritis    knee  . CAD (coronary artery disease)   . CHF (congestive heart failure) ([place])   . COPD (chronic obstructive pulmonary disease) ([place])   . GERD (gastroesophageal reflux disease)   . Heart attack ([place]) 2005   Old MI reported 2005  . History of pulmonary embolism 03/2015  . Hyperlipidemia    . Hypertension   . Ischemic cardiomyopathy   . Obesity   . Oxygen deficiency   . Pulmonary embolism ([place]) 04/10/2015    Past Surgical History:  Procedure Laterality Date  . ANKLE SURGERY Left   . APPENDECTOMY    . CARDIAC CATHETERIZATION  03/27/2015  . CHOLECYSTECTOMY    . CORONARY ARTERY BYPASS GRAFT  03/29/2015   Coronary bypass surgery with LIMA-LAD, SVG-obutse marginal branch/PDA    Social History   Tobacco Use  . Smoking status: Current Every Day Smoker    Packs/day: 0.75    Types: Cigarettes    Start date: 03/23/1966  . Smokeless tobacco: Never Used  Substance Use Topics  . Alcohol use: No    Frequency: Never  . Drug use: No    Family History  Problem Relation Age of Onset  . Stroke Mother   . Depression Mother   . Stroke Father   . Stroke Brother   . Heart attack Brother   . Cancer Brother        rectal/colon  . Asthma Son   . Stroke Brother      No Known Allergies   Current Outpatient Medications:  .  acetaminophen (TYLENOL) 500 MG tablet, Take 500 mg by mouth every 6 (six) hours as needed., Disp: , Rfl:  .  aspirin EC 325 MG tablet, Take  1 tablet by mouth daily., Disp: , Rfl:  .  atorvastatin (LIPITOR) 40 MG tablet, Take 1 tablet (40 mg total) by mouth daily., Disp: 90 tablet, Rfl: 0 .  carvedilol (COREG) 6.25 MG tablet, Take 1 tablet (6.25 mg total) by mouth 2 (two) times daily., Disp: 180 tablet, Rfl: 0 .  naproxen sodium (ALEVE) 220 MG tablet, Take 220 mg by mouth 2 (two) times daily as needed., Disp: , Rfl:  .  ranitidine (ZANTAC) 150 MG tablet, Take 150 mg by mouth at bedtime. , Disp: , Rfl:  .  nitroGLYCERIN (NITROSTAT) 0.4 MG SL tablet, Place 1 tablet (0.4 mg total) under the tongue every 5 (five) minutes as needed for chest pain., Disp: 25 tablet, Rfl: 3   PHYSICAL EXAM SECTION: BP (!) 144/75   Pulse (!) 58   Ht 5\' 8"  (1.727 m)   Wt 239 lb (108.4 kg)   BMI 36.34 kg/m   Body mass index is 36.34 kg/m. General appearance: Well-developed  well-nourished no gross deformities  Eyes clear normal vision no evidence of conjunctivitis or jaundice, extraocular muscles intact  ENT: ears hearing agnormal, or hearing left ear, nasal passages clear, throat clear   Lymph nodes: No lymphadenopathy  Neck is supple without palpable mass, full range of motion  Cardiovascular normal pulse and perfusion in upper extremities and no palpable pulses either foot lower extremities but no edema, note no hair on his feet and feet feel cool to touch   Neurologically deep tendon reflexes 2+ knee bilaterally 0 ankle bilaterally toes downgoing no sensation loss or deficits no pathologic reflexes  Psychological: Awake alert and oriented x3 mood and affect normal  Skin no lacerations or ulcerations no nodularity no palpable masses, no erythema or nodularity  Musculoskeletal:  Lumbar spine tender L4-L5 L5-S1 midline, thoracic and cervical spine nontender.  Skin over the spine all 3 areas normal.  No increase in muscle tension in the back.  Decreased flexion extension rotation.  The right and left lower extremity exhibit normal alignment normal range of motion normal ligament exam normal strength and skin normal   MEDICAL DECISION SECTION:  Encounter Diagnoses  Name Primary?  . Spinal stenosis of lumbar region with neurogenic claudication Yes  . Painful and cold lower extremity   . Cigarette nicotine dependence without complication     Imaging L-spine, see report.  Lumbar lordosis looks normal L5-S1 seem to be arthritic  Plan:  (Rx., Inj., surg., Frx, MRI/CT, XR:2)  Patient counseled on smoking cessation.  Patient will be sent for ABI to check his vasculature in his lower extremities  We will also send him for lumbar MRI for spinal stenosis evaluation  Follow-up after studies have been completed  9:17 AM

## 2018-01-18 ENCOUNTER — Other Ambulatory Visit: Payer: Self-pay | Admitting: Cardiovascular Disease

## 2018-01-20 ENCOUNTER — Ambulatory Visit (HOSPITAL_COMMUNITY)
Admission: RE | Admit: 2018-01-20 | Discharge: 2018-01-20 | Disposition: A | Discharge status: Home / Self Care | Payer: Medicare Other | Source: Ambulatory Visit | Attending: Orthopedic Surgery | Admitting: Orthopedic Surgery

## 2018-01-20 DIAGNOSIS — M79606 Pain in leg, unspecified: Secondary | ICD-10-CM | POA: Diagnosis not present

## 2018-01-20 DIAGNOSIS — M5126 Other intervertebral disc displacement, lumbar region: Secondary | ICD-10-CM | POA: Diagnosis not present

## 2018-01-20 DIAGNOSIS — M48062 Spinal stenosis, lumbar region with neurogenic claudication: Secondary | ICD-10-CM

## 2018-01-20 DIAGNOSIS — M79604 Pain in right leg: Secondary | ICD-10-CM | POA: Diagnosis not present

## 2018-01-20 DIAGNOSIS — M5136 Other intervertebral disc degeneration, lumbar region: Secondary | ICD-10-CM | POA: Insufficient documentation

## 2018-01-20 DIAGNOSIS — R209 Unspecified disturbances of skin sensation: Secondary | ICD-10-CM | POA: Insufficient documentation

## 2018-01-20 DIAGNOSIS — M79605 Pain in left leg: Secondary | ICD-10-CM | POA: Diagnosis not present

## 2018-01-20 DIAGNOSIS — M48061 Spinal stenosis, lumbar region without neurogenic claudication: Secondary | ICD-10-CM | POA: Diagnosis not present

## 2018-01-26 ENCOUNTER — Telehealth: Payer: Self-pay | Admitting: Radiology

## 2018-01-26 ENCOUNTER — Ambulatory Visit (INDEPENDENT_AMBULATORY_CARE_PROVIDER_SITE_OTHER): Payer: Medicare Other | Admitting: Orthopedic Surgery

## 2018-01-26 VITALS — BP 130/70 | HR 53 | Ht 68.0 in | Wt 239.0 lb

## 2018-01-26 DIAGNOSIS — M48062 Spinal stenosis, lumbar region with neurogenic claudication: Secondary | ICD-10-CM

## 2018-01-26 DIAGNOSIS — I25708 Atherosclerosis of coronary artery bypass graft(s), unspecified, with other forms of angina pectoris: Secondary | ICD-10-CM | POA: Diagnosis not present

## 2018-01-26 MED ORDER — GABAPENTIN 100 MG PO CAPS: 100.0000 mg | ORAL_CAPSULE | Freq: Three times a day (TID) | ORAL | 2 refills | Status: DC

## 2018-01-26 MED ORDER — TRAMADOL HCL 50 MG PO TABS: 50.0000 mg | ORAL_TABLET | Freq: Four times a day (QID) | ORAL | 5 refills | Status: DC | PRN

## 2018-01-26 NOTE — Patient Instructions (Signed)
Jose Keith will send information to the neurosurgeon office  You need to establish with a primary care physician. You can call [facility], or check their website to see who is accepting new patients.

## 2018-01-26 NOTE — Telephone Encounter (Signed)
Called her about patient engagement center to help patients find primary care. [phone] is the number, called to advise, hope this helps

## 2018-01-26 NOTE — Progress Notes (Signed)
Chief Complaint  Patient presents with  . Back Pain    Recheck on back, MRI results.    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

## 2018-02-02 ENCOUNTER — Ambulatory Visit: Payer: Medicare Other | Admitting: Family Medicine

## 2018-02-08 DIAGNOSIS — I10 Essential (primary) hypertension: Secondary | ICD-10-CM | POA: Diagnosis not present

## 2018-02-08 DIAGNOSIS — F17210 Nicotine dependence, cigarettes, uncomplicated: Secondary | ICD-10-CM | POA: Diagnosis not present

## 2018-02-08 DIAGNOSIS — H9190 Unspecified hearing loss, unspecified ear: Secondary | ICD-10-CM | POA: Diagnosis not present

## 2018-02-08 DIAGNOSIS — I2510 Atherosclerotic heart disease of native coronary artery without angina pectoris: Secondary | ICD-10-CM | POA: Diagnosis not present

## 2018-02-08 DIAGNOSIS — Z6835 Body mass index (BMI) 35.0-35.9, adult: Secondary | ICD-10-CM | POA: Diagnosis not present

## 2018-02-08 DIAGNOSIS — Z299 Encounter for prophylactic measures, unspecified: Secondary | ICD-10-CM | POA: Diagnosis not present

## 2018-02-08 DIAGNOSIS — M549 Dorsalgia, unspecified: Secondary | ICD-10-CM | POA: Diagnosis not present

## 2018-02-08 DIAGNOSIS — K219 Gastro-esophageal reflux disease without esophagitis: Secondary | ICD-10-CM | POA: Diagnosis not present

## 2018-02-10 DIAGNOSIS — M48062 Spinal stenosis, lumbar region with neurogenic claudication: Secondary | ICD-10-CM | POA: Diagnosis not present

## 2018-03-02 DIAGNOSIS — Z7189 Other specified counseling: Secondary | ICD-10-CM | POA: Diagnosis not present

## 2018-03-02 DIAGNOSIS — Z1339 Encounter for screening examination for other mental health and behavioral disorders: Secondary | ICD-10-CM | POA: Diagnosis not present

## 2018-03-02 DIAGNOSIS — Z6835 Body mass index (BMI) 35.0-35.9, adult: Secondary | ICD-10-CM | POA: Diagnosis not present

## 2018-03-02 DIAGNOSIS — Z1211 Encounter for screening for malignant neoplasm of colon: Secondary | ICD-10-CM | POA: Diagnosis not present

## 2018-03-02 DIAGNOSIS — K5900 Constipation, unspecified: Secondary | ICD-10-CM | POA: Diagnosis not present

## 2018-03-02 DIAGNOSIS — Z1331 Encounter for screening for depression: Secondary | ICD-10-CM | POA: Diagnosis not present

## 2018-03-02 DIAGNOSIS — E7800 Pure hypercholesterolemia, unspecified: Secondary | ICD-10-CM | POA: Diagnosis not present

## 2018-03-02 DIAGNOSIS — Z2821 Immunization not carried out because of patient refusal: Secondary | ICD-10-CM | POA: Diagnosis not present

## 2018-03-02 DIAGNOSIS — Z299 Encounter for prophylactic measures, unspecified: Secondary | ICD-10-CM | POA: Diagnosis not present

## 2018-03-02 DIAGNOSIS — R5383 Other fatigue: Secondary | ICD-10-CM | POA: Diagnosis not present

## 2018-03-02 DIAGNOSIS — I10 Essential (primary) hypertension: Secondary | ICD-10-CM | POA: Diagnosis not present

## 2018-03-02 DIAGNOSIS — Z0000 Encounter for general adult medical examination without abnormal findings: Secondary | ICD-10-CM | POA: Diagnosis not present

## 2018-03-07 DIAGNOSIS — R5383 Other fatigue: Secondary | ICD-10-CM | POA: Diagnosis not present

## 2018-03-07 DIAGNOSIS — Z125 Encounter for screening for malignant neoplasm of prostate: Secondary | ICD-10-CM | POA: Diagnosis not present

## 2018-03-07 DIAGNOSIS — Z79899 Other long term (current) drug therapy: Secondary | ICD-10-CM | POA: Diagnosis not present

## 2018-03-07 DIAGNOSIS — E7800 Pure hypercholesterolemia, unspecified: Secondary | ICD-10-CM | POA: Diagnosis not present

## 2018-06-01 DIAGNOSIS — Z6835 Body mass index (BMI) 35.0-35.9, adult: Secondary | ICD-10-CM | POA: Diagnosis not present

## 2018-06-01 DIAGNOSIS — M4800 Spinal stenosis, site unspecified: Secondary | ICD-10-CM | POA: Diagnosis not present

## 2018-06-01 DIAGNOSIS — H9190 Unspecified hearing loss, unspecified ear: Secondary | ICD-10-CM | POA: Diagnosis not present

## 2018-06-01 DIAGNOSIS — J449 Chronic obstructive pulmonary disease, unspecified: Secondary | ICD-10-CM | POA: Diagnosis not present

## 2018-06-01 DIAGNOSIS — F17210 Nicotine dependence, cigarettes, uncomplicated: Secondary | ICD-10-CM | POA: Diagnosis not present

## 2018-06-01 DIAGNOSIS — M549 Dorsalgia, unspecified: Secondary | ICD-10-CM | POA: Diagnosis not present

## 2018-06-01 DIAGNOSIS — I10 Essential (primary) hypertension: Secondary | ICD-10-CM | POA: Diagnosis not present

## 2018-06-01 DIAGNOSIS — Z299 Encounter for prophylactic measures, unspecified: Secondary | ICD-10-CM | POA: Diagnosis not present

## 2018-06-10 DIAGNOSIS — Z6836 Body mass index (BMI) 36.0-36.9, adult: Secondary | ICD-10-CM | POA: Diagnosis not present

## 2018-06-10 DIAGNOSIS — I2510 Atherosclerotic heart disease of native coronary artery without angina pectoris: Secondary | ICD-10-CM | POA: Diagnosis not present

## 2018-06-10 DIAGNOSIS — I10 Essential (primary) hypertension: Secondary | ICD-10-CM | POA: Diagnosis not present

## 2018-06-10 DIAGNOSIS — M4800 Spinal stenosis, site unspecified: Secondary | ICD-10-CM | POA: Diagnosis not present

## 2018-06-10 DIAGNOSIS — Z299 Encounter for prophylactic measures, unspecified: Secondary | ICD-10-CM | POA: Diagnosis not present

## 2018-06-10 DIAGNOSIS — J449 Chronic obstructive pulmonary disease, unspecified: Secondary | ICD-10-CM | POA: Diagnosis not present

## 2018-07-05 ENCOUNTER — Other Ambulatory Visit: Payer: Self-pay | Admitting: Cardiovascular Disease

## 2018-07-19 ENCOUNTER — Other Ambulatory Visit: Payer: Self-pay | Admitting: Cardiovascular Disease

## 2018-10-14 ENCOUNTER — Other Ambulatory Visit: Payer: Self-pay | Admitting: Cardiovascular Disease

## 2018-12-20 ENCOUNTER — Other Ambulatory Visit: Payer: Self-pay | Admitting: Cardiovascular Disease

## 2019-01-06 ENCOUNTER — Other Ambulatory Visit: Payer: Self-pay | Admitting: Cardiovascular Disease

## 2019-01-06 ENCOUNTER — Telehealth: Payer: Self-pay | Admitting: Cardiovascular Disease

## 2019-01-06 MED ORDER — ATORVASTATIN CALCIUM 40 MG PO TABS: 40.0000 mg | ORAL_TABLET | Freq: Every day | ORAL | 0 refills | Status: DC

## 2019-01-06 NOTE — Telephone Encounter (Signed)
Virtual Visit Pre-Appointment Phone Call  "(Name), I am calling you today to discuss your upcoming appointment. We are currently trying to limit exposure to the virus that causes COVID-19 by seeing patients at home rather than in the office."  1. "What is the BEST phone number to call the day of the visit?" - include this in appointment notes  2. Do you have or have access to (through a family member/friend) a smartphone with video capability that we can use for your visit?" a. If yes - list this number in appt notes as cell (if different from BEST phone #) and list the appointment type as a VIDEO visit in appointment notes b. If no - list the appointment type as a PHONE visit in appointment notes  Confirm consent - "In the setting of the current Covid19 crisis, you are scheduled for a (phone or video) visit with your provider on (date) at (time).  Just as we do with many in-office visits, in order for you to participate in this visit, we must obtain consent.  If you'd like, I can send this to your mychart (if signed up) or email for you to review.  Otherwise, I can obtain your verbal consent now.  All virtual visits are billed to your insurance company just like a normal visit would be.  By agreeing to a virtual visit, we'd like you to understand that the technology does not allow for your provider to perform an examination, and thus may limit your provider's ability to fully assess your condition. If your provider identifies any concerns that need to be evaluated in person, we will make arrangements to do so.  Finally, though the technology is pretty good, we cannot assure that it will always work on either your or our end, and in the setting of a video visit, we may have to convert it to a phone-only visit.  In either situation, we cannot ensure that we have a secure connection.  Are you willing to proceed?" STAFF: Did the patient verbally acknowledge consent to telehealth visit? Document  YES/NO here: yes 3. Advise patient to be prepared - "Two hours prior to your appointment, go ahead and check your blood pressure, pulse, oxygen saturation, and your weight (if you have the equipment to check those) and write them all down. When your visit starts, your provider will ask you for this information. If you have an Apple Watch or Kardia device, please plan to have heart rate information ready on the day of your appointment. Please have a pen and paper handy nearby the day of the visit as well."  4. Give patient instructions for MyChart download to smartphone OR Doximity/Doxy.me as below if video visit (depending on what platform provider is using)  5. Inform patient they will receive a phone call 15 minutes prior to their appointment time (may be from unknown caller ID) so they should be prepared to answer    TELEPHONE CALL NOTE  Jose Keith has been deemed a candidate for a follow-up tele-health visit to limit community exposure during the Covid-19 pandemic. I spoke with the patient via phone to ensure availability of phone/video source, confirm preferred email & phone number, and discuss instructions and expectations.  I reminded Jose Keith to be prepared with any vital sign and/or heart rhythm information that could potentially be obtained via home monitoring, at the time of his visit. I reminded Jose Keith to expect a phone call prior to his visit.  Weston Anna 01/06/2019 5:01 PM   INSTRUCTIONS FOR DOWNLOADING THE MYCHART APP TO SMARTPHONE  - The patient must first make sure to have activated MyChart and know their login information - If Apple, go to [employer] and type in MyChart in the search bar and download the app. If Android, ask patient to go to [employer] and type in [place] in the search bar and download the app. The app is free but as with any other app downloads, their phone may require them to verify saved payment information or  Apple/Android password.  - The patient will need to then log into the app with their MyChart username and password, and select [facility] as their healthcare provider to link the account. When it is time for your visit, go to the MyChart app, find appointments, and click Begin Video Visit. Be sure to Select Allow for your device to access the Microphone and Camera for your visit. You will then be connected, and your provider will be with you shortly.  **If they have any issues connecting, or need assistance please contact MyChart service desk [phone])**  **If using a computer, in order to ensure the best quality for their visit they will need to use either of the following Internet Browsers: [employer], or Google Chrome**  IF USING DOXIMITY or DOXY.ME - The patient will receive a link just prior to their visit by text.     FULL LENGTH CONSENT FOR TELE-HEALTH VISIT   I hereby voluntarily request, consent and authorize [facility] and its employed or contracted physicians, physician assistants, nurse practitioners or other licensed health care professionals (the Practitioner), to provide me with telemedicine health care services (the Services") as deemed necessary by the treating Practitioner. I acknowledge and consent to receive the Services by the Practitioner via telemedicine. I understand that the telemedicine visit will involve communicating with the Practitioner through live audiovisual communication technology and the disclosure of certain medical information by electronic transmission. I acknowledge that I have been given the opportunity to request an in-person assessment or other available alternative prior to the telemedicine visit and am voluntarily participating in the telemedicine visit.  I understand that I have the right to withhold or withdraw my consent to the use of telemedicine in the course of my care at any time, without affecting my right to future care  or treatment, and that the Practitioner or I may terminate the telemedicine visit at any time. I understand that I have the right to inspect all information obtained and/or recorded in the course of the telemedicine visit and may receive copies of available information for a reasonable fee.  I understand that some of the potential risks of receiving the Services via telemedicine include:   Delay or interruption in medical evaluation due to technological equipment failure or disruption;  Information transmitted may not be sufficient (e.g. poor resolution of images) to allow for appropriate medical decision making by the Practitioner; and/or   In rare instances, security protocols could fail, causing a breach of personal health information.  Furthermore, I acknowledge that it is my responsibility to provide information about my medical history, conditions and care that is complete and accurate to the best of my ability. I acknowledge that Practitioner's advice, recommendations, and/or decision may be based on factors not within their control, such as incomplete or inaccurate data provided by me or distortions of diagnostic images or specimens that may result from electronic transmissions. I understand that the  practice of medicine is not an Chief Strategy Officer and that Practitioner makes no warranties or guarantees regarding treatment outcomes. I acknowledge that I will receive a copy of this consent concurrently upon execution via email to the email address I last provided but may also request a printed copy by calling the office of [facility].    I understand that my insurance will be billed for this visit.   I have read or had this consent read to me.  I understand the contents of this consent, which adequately explains the benefits and risks of the Services being provided via telemedicine.   I have been provided ample opportunity to ask questions regarding this consent and the Services and have had  my questions answered to my satisfaction.  I give my informed consent for the services to be provided through the use of telemedicine in my medical care  By participating in this telemedicine visit I agree to the above.

## 2019-01-06 NOTE — Telephone Encounter (Signed)
°*  STAT* If patient is at the pharmacy, call can be transferred to refill team.   1. Which medications need to be refilled?atorvastatin (LIPITOR) 40 MG tablet  2. Which pharmacy/location (including street and city if local pharmacy) is medication to be sent to? [facility]   3. Do they need a 30 day or 90 day supply?

## 2019-01-06 NOTE — Telephone Encounter (Signed)
Medication sent to pharmacy  

## 2019-01-10 ENCOUNTER — Other Ambulatory Visit: Payer: Self-pay | Admitting: Cardiovascular Disease

## 2019-01-18 DIAGNOSIS — Z1211 Encounter for screening for malignant neoplasm of colon: Secondary | ICD-10-CM | POA: Diagnosis not present

## 2019-02-01 ENCOUNTER — Telehealth (INDEPENDENT_AMBULATORY_CARE_PROVIDER_SITE_OTHER): Payer: Medicare Other | Admitting: Cardiovascular Disease

## 2019-02-01 ENCOUNTER — Encounter: Payer: Self-pay | Admitting: Cardiovascular Disease

## 2019-02-01 VITALS — Ht 69.0 in | Wt 251.0 lb

## 2019-02-01 DIAGNOSIS — I10 Essential (primary) hypertension: Secondary | ICD-10-CM

## 2019-02-01 DIAGNOSIS — I25708 Atherosclerosis of coronary artery bypass graft(s), unspecified, with other forms of angina pectoris: Secondary | ICD-10-CM | POA: Diagnosis not present

## 2019-02-01 DIAGNOSIS — Z720 Tobacco use: Secondary | ICD-10-CM

## 2019-02-01 DIAGNOSIS — E785 Hyperlipidemia, unspecified: Secondary | ICD-10-CM

## 2019-02-01 MED ORDER — ASPIRIN EC 81 MG PO TBEC: 81.0000 mg | DELAYED_RELEASE_TABLET | Freq: Every day | ORAL | Status: AC

## 2019-02-01 NOTE — Addendum Note (Signed)
Addended by: Laurine Blazer on: 02/01/2019 01:28 PM   Modules accepted: Orders

## 2019-02-01 NOTE — Progress Notes (Signed)
Virtual Visit via Telephone Note   This visit type was conducted due to national recommendations for restrictions regarding the COVID-19 Pandemic (e.g. social distancing) in an effort to limit this patient's exposure and mitigate transmission in our community.  Due to his co-morbid illnesses, this patient is at least at moderate risk for complications without adequate follow up.  This format is felt to be most appropriate for this patient at this time.  The patient did not have access to video technology/had technical difficulties with video requiring transitioning to audio format only (telephone).  All issues noted in this document were discussed and addressed.  No physical exam could be performed with this format.  Please refer to the patient's chart for his  consent to telehealth for [facility].   Date:  02/01/2019   ID:  Jose Keith, DOB 01/31/1951, MRN [id number]  Patient Location: Home Provider Location: Office  PCP:  Monico Blitz, MD  Cardiologist:  Kate Sable, MD  Electrophysiologist:  None   Evaluation Performed:  Follow-Up Visit  Chief Complaint:  CAD  History of Present Illness:    Jose Keith is a 68 y.o. male with a history of coronary artery bypass graft surgery in January 2017 with a LIMA to the LAD, SVG to the obtuse marginal branch and PDA.   Echocardiogram in January 2017demonstrated mildly reduced left ventricular systolic function, LVEF [id number], with distal septal and apical dyskinesis. 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  He denies chest pain and leg swelling.   He has chronic exertional dyspnea which is stable.  He has a long history of tobacco use.  His primary complaints relate to bilateral knee arthritis and chronic lower back pain with radiation of pain down both legs.  He was told he may need  surgery but he has deferred for now.   Social history: He moved to [place] from [place]. He used to be a Administrator, [employer] in [place].  He lives with his daughter, Jose Keith.   Past Medical History:  Diagnosis Date  . Arthritis    knee  . CAD (coronary artery disease)   . CHF (congestive heart failure) ([place])   . COPD (chronic obstructive pulmonary disease) ([place])   . GERD (gastroesophageal reflux disease)   . 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 History of pulmonary embolism 03/2015  . Hyperlipidemia   . Hypertension   . Ischemic cardiomyopathy   . Obesity   . Oxygen deficiency   . Pulmonary embolism ([place]) 04/10/2015   Past Surgical History:  Procedure Laterality Date  . ANKLE SURGERY Left   . APPENDECTOMY    . CARDIAC CATHETERIZATION  03/27/2015  . CHOLECYSTECTOMY    . CORONARY ARTERY BYPASS GRAFT  03/29/2015   Coronary bypass surgery with LIMA-LAD, SVG-obutse marginal branch/PDA     Current Meds  Medication Sig  . aspirin EC 325 MG tablet Take 1 tablet by mouth daily.  Marland Kitchen atorvastatin (LIPITOR) 40 MG tablet Take 1 tablet by mouth once daily  . carvedilol (COREG) 6.25 MG tablet Take 1 tablet by mouth twice daily  . gabapentin (NEURONTIN) 100 MG capsule Take 1 capsule (100 mg total) by mouth 3 (three) times daily.  . naproxen sodium (ALEVE) 220 MG tablet Take 220 mg by mouth as needed.  . nitroGLYCERIN (NITROSTAT) 0.4 MG SL tablet Place 1 tablet (0.4 mg total)  under the tongue every 5 (five) minutes as needed for chest pain.  . ranitidine (ZANTAC) 150 MG tablet Take 150 mg by mouth at bedtime.   . traMADol (ULTRAM) 50 MG tablet Take 1 tablet (50 mg total) by mouth every 6 (six) hours as needed.     Allergies:   Patient has no known allergies.   Social History   Tobacco Use  . Smoking status: Current Every Day Smoker    Packs/day: 0.50    Types: Cigarettes    Start date: 03/23/1966  . Smokeless tobacco: Never Used  Substance Use Topics  . Alcohol use: No     Frequency: Never  . Drug use: No     Family Hx: The patient's family history includes Asthma in his son; Cancer in his brother; Depression in his mother; Heart attack in his brother; Stroke in his brother, brother, father, and mother.  ROS:   Please see the history of present illness.     All other systems reviewed and are negative.   Prior CV studies:   The following studies were reviewed today:  NA  Labs/Other Tests and Data Reviewed:    EKG:  No ECG reviewed.  Recent Labs: No results found for requested labs within last 8760 hours.   Recent Lipid Panel Lab Results  Component Value Date/Time   CHOL 111 04/01/2017 09:32 AM   TRIG 152 (H) 04/01/2017 09:32 AM   HDL 29 (L) 04/01/2017 09:32 AM   CHOLHDL 3.8 04/01/2017 09:32 AM   LDLCALC 59 04/01/2017 09:32 AM    Wt Readings from Last 3 Encounters:  02/01/19 251 lb (113.9 kg)  01/26/18 239 lb (108.4 kg)  01/17/18 239 lb (108.4 kg)     Objective:    Vital Signs:  Ht 5\' 9"  (1.753 m)   Wt 251 lb (113.9 kg)   BMI 37.07 kg/m    VITAL SIGNS:  reviewed  ASSESSMENT & PLAN:    1. Coronary artery disease with history of CABG: Symptomatically stable. Continue aspirin (will reduce to 81 mg daily), carvedilol, and atorvastatin.  2. Hypertension: No changes to therapy.  3. Hyperlipidemia: I will obtain a copy of lipids from PCP. Continue atorvastatin 40 mg.  4. Tobacco abuse disorder.   COVID-19 Education: The signs and symptoms of COVID-19 were discussed with the patient and how to seek care for testing (follow up with PCP or arrange E-visit).  The importance of social distancing was discussed today.  Time:   Today, I have spent 5 minutes with the patient with telehealth technology discussing the above problems.     Medication Adjustments/Labs and Tests Ordered: Current medicines are reviewed at length with the patient today.  Concerns regarding medicines are outlined above.   Tests Ordered: No orders  of the defined types were placed in this encounter.   Medication Changes: No orders of the defined types were placed in this encounter.   Follow Up:  Either In Person or Virtual in 1 year(s)  Signed, Kate Sable, MD  02/01/2019 12:02 PM    [facility]

## 2019-02-01 NOTE — Patient Instructions (Signed)
Medication Instructions:   Decrease Aspirin to 81mg  daily.  Continue all other medications.    Labwork: none  Testing/Procedures: none  Follow-Up: Your physician wants you to follow up in:  1 year.  You will receive a reminder letter in the mail one-two months in advance.  If you don't receive a letter, please call our office to schedule the follow up appointment   Any Other Special Instructions Will Be Listed Below (If Applicable).  If you need a refill on your cardiac medications before your next appointment, please call your pharmacy.

## 2019-02-02 ENCOUNTER — Encounter: Payer: Self-pay | Admitting: *Deleted

## 2019-02-21 DIAGNOSIS — Z01818 Encounter for other preprocedural examination: Secondary | ICD-10-CM | POA: Diagnosis not present

## 2019-02-23 DIAGNOSIS — K644 Residual hemorrhoidal skin tags: Secondary | ICD-10-CM | POA: Diagnosis not present

## 2019-02-23 DIAGNOSIS — N400 Enlarged prostate without lower urinary tract symptoms: Secondary | ICD-10-CM | POA: Diagnosis not present

## 2019-02-23 DIAGNOSIS — E785 Hyperlipidemia, unspecified: Secondary | ICD-10-CM | POA: Diagnosis not present

## 2019-02-23 DIAGNOSIS — Z1211 Encounter for screening for malignant neoplasm of colon: Secondary | ICD-10-CM | POA: Diagnosis not present

## 2019-02-23 DIAGNOSIS — I2510 Atherosclerotic heart disease of native coronary artery without angina pectoris: Secondary | ICD-10-CM | POA: Diagnosis not present

## 2019-02-23 DIAGNOSIS — J449 Chronic obstructive pulmonary disease, unspecified: Secondary | ICD-10-CM | POA: Diagnosis not present

## 2019-02-23 DIAGNOSIS — G629 Polyneuropathy, unspecified: Secondary | ICD-10-CM | POA: Diagnosis not present

## 2019-02-23 DIAGNOSIS — K219 Gastro-esophageal reflux disease without esophagitis: Secondary | ICD-10-CM | POA: Diagnosis not present

## 2019-02-23 DIAGNOSIS — K641 Second degree hemorrhoids: Secondary | ICD-10-CM | POA: Diagnosis not present

## 2019-02-23 DIAGNOSIS — I252 Old myocardial infarction: Secondary | ICD-10-CM | POA: Diagnosis not present

## 2019-02-23 DIAGNOSIS — D126 Benign neoplasm of colon, unspecified: Secondary | ICD-10-CM | POA: Diagnosis not present

## 2019-02-23 DIAGNOSIS — K635 Polyp of colon: Secondary | ICD-10-CM | POA: Diagnosis not present

## 2019-02-23 DIAGNOSIS — I110 Hypertensive heart disease with heart failure: Secondary | ICD-10-CM | POA: Diagnosis not present

## 2019-02-23 DIAGNOSIS — D120 Benign neoplasm of cecum: Secondary | ICD-10-CM | POA: Diagnosis not present

## 2019-02-23 DIAGNOSIS — D125 Benign neoplasm of sigmoid colon: Secondary | ICD-10-CM | POA: Diagnosis not present

## 2019-02-23 DIAGNOSIS — I509 Heart failure, unspecified: Secondary | ICD-10-CM | POA: Diagnosis not present

## 2019-02-23 DIAGNOSIS — Z86711 Personal history of pulmonary embolism: Secondary | ICD-10-CM | POA: Diagnosis not present

## 2019-03-06 ENCOUNTER — Encounter: Payer: Self-pay | Admitting: Cardiology

## 2019-03-06 DIAGNOSIS — Z125 Encounter for screening for malignant neoplasm of prostate: Secondary | ICD-10-CM | POA: Diagnosis not present

## 2019-03-06 DIAGNOSIS — Z7189 Other specified counseling: Secondary | ICD-10-CM | POA: Diagnosis not present

## 2019-03-06 DIAGNOSIS — Z1211 Encounter for screening for malignant neoplasm of colon: Secondary | ICD-10-CM | POA: Diagnosis not present

## 2019-03-06 DIAGNOSIS — Z2821 Immunization not carried out because of patient refusal: Secondary | ICD-10-CM | POA: Diagnosis not present

## 2019-03-06 DIAGNOSIS — E7800 Pure hypercholesterolemia, unspecified: Secondary | ICD-10-CM | POA: Diagnosis not present

## 2019-03-06 DIAGNOSIS — M549 Dorsalgia, unspecified: Secondary | ICD-10-CM | POA: Diagnosis not present

## 2019-03-06 DIAGNOSIS — Z79899 Other long term (current) drug therapy: Secondary | ICD-10-CM | POA: Diagnosis not present

## 2019-03-06 DIAGNOSIS — I10 Essential (primary) hypertension: Secondary | ICD-10-CM | POA: Diagnosis not present

## 2019-03-06 DIAGNOSIS — Z299 Encounter for prophylactic measures, unspecified: Secondary | ICD-10-CM | POA: Diagnosis not present

## 2019-03-06 DIAGNOSIS — R5383 Other fatigue: Secondary | ICD-10-CM | POA: Diagnosis not present

## 2019-03-06 DIAGNOSIS — Z1331 Encounter for screening for depression: Secondary | ICD-10-CM | POA: Diagnosis not present

## 2019-03-06 DIAGNOSIS — Z1339 Encounter for screening examination for other mental health and behavioral disorders: Secondary | ICD-10-CM | POA: Diagnosis not present

## 2019-03-06 DIAGNOSIS — Z0000 Encounter for general adult medical examination without abnormal findings: Secondary | ICD-10-CM | POA: Diagnosis not present

## 2019-03-06 DIAGNOSIS — Z6836 Body mass index (BMI) 36.0-36.9, adult: Secondary | ICD-10-CM | POA: Diagnosis not present

## 2019-04-05 DIAGNOSIS — M549 Dorsalgia, unspecified: Secondary | ICD-10-CM | POA: Diagnosis not present

## 2019-04-05 DIAGNOSIS — I10 Essential (primary) hypertension: Secondary | ICD-10-CM | POA: Diagnosis not present

## 2019-04-05 DIAGNOSIS — F17210 Nicotine dependence, cigarettes, uncomplicated: Secondary | ICD-10-CM | POA: Diagnosis not present

## 2019-04-05 DIAGNOSIS — Z6827 Body mass index (BMI) 27.0-27.9, adult: Secondary | ICD-10-CM | POA: Diagnosis not present

## 2019-04-05 DIAGNOSIS — R350 Frequency of micturition: Secondary | ICD-10-CM | POA: Diagnosis not present

## 2019-04-05 DIAGNOSIS — Z299 Encounter for prophylactic measures, unspecified: Secondary | ICD-10-CM | POA: Diagnosis not present

## 2019-04-08 ENCOUNTER — Other Ambulatory Visit: Payer: Self-pay | Admitting: Cardiovascular Disease

## 2019-05-10 ENCOUNTER — Other Ambulatory Visit: Payer: Self-pay | Admitting: Cardiovascular Disease

## 2019-06-05 DIAGNOSIS — N281 Cyst of kidney, acquired: Secondary | ICD-10-CM | POA: Diagnosis not present

## 2019-06-05 DIAGNOSIS — I252 Old myocardial infarction: Secondary | ICD-10-CM | POA: Diagnosis not present

## 2019-06-05 DIAGNOSIS — M5442 Lumbago with sciatica, left side: Secondary | ICD-10-CM | POA: Diagnosis not present

## 2019-06-05 DIAGNOSIS — E279 Disorder of adrenal gland, unspecified: Secondary | ICD-10-CM | POA: Diagnosis not present

## 2019-06-05 DIAGNOSIS — K760 Fatty (change of) liver, not elsewhere classified: Secondary | ICD-10-CM | POA: Diagnosis not present

## 2019-06-05 DIAGNOSIS — M545 Low back pain: Secondary | ICD-10-CM | POA: Diagnosis not present

## 2019-06-05 DIAGNOSIS — Z86711 Personal history of pulmonary embolism: Secondary | ICD-10-CM | POA: Diagnosis not present

## 2019-06-05 DIAGNOSIS — M5489 Other dorsalgia: Secondary | ICD-10-CM | POA: Diagnosis not present

## 2019-06-05 DIAGNOSIS — I2510 Atherosclerotic heart disease of native coronary artery without angina pectoris: Secondary | ICD-10-CM | POA: Diagnosis not present

## 2019-06-05 DIAGNOSIS — R350 Frequency of micturition: Secondary | ICD-10-CM | POA: Diagnosis not present

## 2019-06-05 DIAGNOSIS — K429 Umbilical hernia without obstruction or gangrene: Secondary | ICD-10-CM | POA: Diagnosis not present

## 2019-06-05 DIAGNOSIS — K219 Gastro-esophageal reflux disease without esophagitis: Secondary | ICD-10-CM | POA: Diagnosis not present

## 2019-06-05 DIAGNOSIS — I700 Atherosclerosis of aorta: Secondary | ICD-10-CM | POA: Diagnosis not present

## 2019-06-05 DIAGNOSIS — G629 Polyneuropathy, unspecified: Secondary | ICD-10-CM | POA: Diagnosis not present

## 2019-06-05 DIAGNOSIS — J449 Chronic obstructive pulmonary disease, unspecified: Secondary | ICD-10-CM | POA: Diagnosis not present

## 2019-06-05 DIAGNOSIS — I110 Hypertensive heart disease with heart failure: Secondary | ICD-10-CM | POA: Diagnosis not present

## 2019-06-05 DIAGNOSIS — N400 Enlarged prostate without lower urinary tract symptoms: Secondary | ICD-10-CM | POA: Diagnosis not present

## 2019-06-05 DIAGNOSIS — I10 Essential (primary) hypertension: Secondary | ICD-10-CM | POA: Diagnosis not present

## 2019-06-05 DIAGNOSIS — R52 Pain, unspecified: Secondary | ICD-10-CM | POA: Diagnosis not present

## 2019-06-05 DIAGNOSIS — I509 Heart failure, unspecified: Secondary | ICD-10-CM | POA: Diagnosis not present

## 2019-06-05 DIAGNOSIS — Z7982 Long term (current) use of aspirin: Secondary | ICD-10-CM | POA: Diagnosis not present

## 2019-06-05 DIAGNOSIS — R0902 Hypoxemia: Secondary | ICD-10-CM | POA: Diagnosis not present

## 2019-06-05 DIAGNOSIS — Z79899 Other long term (current) drug therapy: Secondary | ICD-10-CM | POA: Diagnosis not present

## 2019-06-07 DIAGNOSIS — M549 Dorsalgia, unspecified: Secondary | ICD-10-CM | POA: Diagnosis not present

## 2019-06-07 DIAGNOSIS — Z299 Encounter for prophylactic measures, unspecified: Secondary | ICD-10-CM | POA: Diagnosis not present

## 2019-06-07 DIAGNOSIS — M4800 Spinal stenosis, site unspecified: Secondary | ICD-10-CM | POA: Diagnosis not present

## 2019-06-07 DIAGNOSIS — F17210 Nicotine dependence, cigarettes, uncomplicated: Secondary | ICD-10-CM | POA: Diagnosis not present

## 2019-06-07 DIAGNOSIS — I10 Essential (primary) hypertension: Secondary | ICD-10-CM | POA: Diagnosis not present

## 2019-06-19 DIAGNOSIS — M79605 Pain in left leg: Secondary | ICD-10-CM | POA: Diagnosis not present

## 2019-06-19 DIAGNOSIS — M48061 Spinal stenosis, lumbar region without neurogenic claudication: Secondary | ICD-10-CM | POA: Diagnosis not present

## 2019-06-19 DIAGNOSIS — M545 Low back pain: Secondary | ICD-10-CM | POA: Diagnosis not present

## 2019-06-21 DIAGNOSIS — F17210 Nicotine dependence, cigarettes, uncomplicated: Secondary | ICD-10-CM | POA: Diagnosis not present

## 2019-06-21 DIAGNOSIS — M4800 Spinal stenosis, site unspecified: Secondary | ICD-10-CM | POA: Diagnosis not present

## 2019-06-21 DIAGNOSIS — Z299 Encounter for prophylactic measures, unspecified: Secondary | ICD-10-CM | POA: Diagnosis not present

## 2019-06-21 DIAGNOSIS — Z6837 Body mass index (BMI) 37.0-37.9, adult: Secondary | ICD-10-CM | POA: Diagnosis not present

## 2019-06-21 DIAGNOSIS — I700 Atherosclerosis of aorta: Secondary | ICD-10-CM | POA: Diagnosis not present

## 2019-06-21 DIAGNOSIS — I10 Essential (primary) hypertension: Secondary | ICD-10-CM | POA: Diagnosis not present

## 2019-06-21 DIAGNOSIS — M549 Dorsalgia, unspecified: Secondary | ICD-10-CM | POA: Diagnosis not present

## 2019-06-29 DIAGNOSIS — M48062 Spinal stenosis, lumbar region with neurogenic claudication: Secondary | ICD-10-CM | POA: Diagnosis not present

## 2019-07-06 DIAGNOSIS — M48062 Spinal stenosis, lumbar region with neurogenic claudication: Secondary | ICD-10-CM | POA: Diagnosis not present

## 2019-07-06 DIAGNOSIS — I2510 Atherosclerotic heart disease of native coronary artery without angina pectoris: Secondary | ICD-10-CM | POA: Diagnosis not present

## 2019-07-06 DIAGNOSIS — E1159 Type 2 diabetes mellitus with other circulatory complications: Secondary | ICD-10-CM | POA: Diagnosis not present

## 2019-07-19 DIAGNOSIS — Z6837 Body mass index (BMI) 37.0-37.9, adult: Secondary | ICD-10-CM | POA: Diagnosis not present

## 2019-07-19 DIAGNOSIS — F17210 Nicotine dependence, cigarettes, uncomplicated: Secondary | ICD-10-CM | POA: Diagnosis not present

## 2019-07-19 DIAGNOSIS — J449 Chronic obstructive pulmonary disease, unspecified: Secondary | ICD-10-CM | POA: Diagnosis not present

## 2019-07-19 DIAGNOSIS — Z299 Encounter for prophylactic measures, unspecified: Secondary | ICD-10-CM | POA: Diagnosis not present

## 2019-07-19 DIAGNOSIS — I10 Essential (primary) hypertension: Secondary | ICD-10-CM | POA: Diagnosis not present

## 2019-08-09 DIAGNOSIS — M48062 Spinal stenosis, lumbar region with neurogenic claudication: Secondary | ICD-10-CM | POA: Diagnosis not present

## 2019-10-18 DIAGNOSIS — M48062 Spinal stenosis, lumbar region with neurogenic claudication: Secondary | ICD-10-CM | POA: Diagnosis not present

## 2020-02-01 ENCOUNTER — Encounter: Payer: Self-pay | Admitting: *Deleted

## 2020-02-01 ENCOUNTER — Ambulatory Visit (INDEPENDENT_AMBULATORY_CARE_PROVIDER_SITE_OTHER): Payer: Medicare Other | Admitting: Cardiology

## 2020-02-01 ENCOUNTER — Encounter: Payer: Self-pay | Admitting: Cardiology

## 2020-02-01 VITALS — BP 120/84 | HR 59 | Ht 69.0 in | Wt 252.0 lb

## 2020-02-01 DIAGNOSIS — E782 Mixed hyperlipidemia: Secondary | ICD-10-CM

## 2020-02-01 DIAGNOSIS — I2510 Atherosclerotic heart disease of native coronary artery without angina pectoris: Secondary | ICD-10-CM | POA: Diagnosis not present

## 2020-02-01 DIAGNOSIS — I10 Essential (primary) hypertension: Secondary | ICD-10-CM

## 2020-02-01 NOTE — Progress Notes (Signed)
Clinical Summary Jose Keith is a 69 y.o.male seen today for follow up of the following medical problems.    1. CAD - history of prior CABG in Jan 2017 with LIMA-LAD, SVG-OM, SVG-PDA - Echocardiogram in January 2017demonstrated mildly reduced left ventricular systolic function, LVEF 33-82%, with distal septal and apical dyskinesis.   - infrequent chest "cringes". No exertional chest pains - compliant with meds   2. HTN - compliant with meds  3. Hyperlipidemia - compliant with meds - labs followed by pcp   Past Medical History:  Diagnosis Date  . Arthritis    knee  . CAD (coronary artery disease)   . CHF (congestive heart failure) ([facility])   . COPD (chronic obstructive pulmonary disease) ([place])   . GERD (gastroesophageal reflux disease)   . Heart attack ([place]) 2005   Old MI reported 2005  . History of pulmonary embolism 03/2015  . Hyperlipidemia   . Hypertension   . Ischemic cardiomyopathy   . Obesity   . Oxygen deficiency   . Pulmonary embolism ([place]) 04/10/2015     No Known Allergies   Current Outpatient Medications  Medication Sig Dispense Refill  . aspirin EC 81 MG tablet Take 1 tablet (81 mg total) by mouth daily.    Marland Kitchen atorvastatin (LIPITOR) 40 MG tablet Take 1 tablet by mouth once daily 90 tablet 2  . carvedilol (COREG) 6.25 MG tablet Take 1 tablet by mouth twice daily 180 tablet 1  . gabapentin (NEURONTIN) 100 MG capsule Take 1 capsule (100 mg total) by mouth 3 (three) times daily. 90 capsule 2  . naproxen sodium (ALEVE) 220 MG tablet Take 220 mg by mouth as needed.    . nitroGLYCERIN (NITROSTAT) 0.4 MG SL tablet Place 1 tablet (0.4 mg total) under the tongue every 5 (five) minutes as needed for chest pain. 25 tablet 3  . ranitidine (ZANTAC) 150 MG tablet Take 150 mg by mouth at bedtime.     . traMADol (ULTRAM) 50 MG tablet Take 1 tablet (50 mg total) by mouth every 6 (six) hours as needed. 60 tablet 5   No current facility-administered medications for  this visit.     Past Surgical History:  Procedure Laterality Date  . ANKLE SURGERY Left   . APPENDECTOMY    . CARDIAC CATHETERIZATION  03/27/2015  . CHOLECYSTECTOMY    . CORONARY ARTERY BYPASS GRAFT  03/29/2015   Coronary bypass surgery with LIMA-LAD, SVG-obutse marginal Jeny Nield/PDA     No Known Allergies    Family History  Problem Relation Age of Onset  . Stroke Mother   . Depression Mother   . Stroke Father   . Stroke Brother   . Heart attack Brother   . Cancer Brother        rectal/colon  . Asthma Son   . Stroke Brother      Social History Jose Keith reports that he has been smoking cigarettes. He started smoking about 53 years ago. He has been smoking about 0.50 packs per day. He has never used smokeless tobacco. Jose Keith reports no history of alcohol use.   Review of Systems CONSTITUTIONAL: No weight loss, fever, chills, weakness or fatigue.  HEENT: Eyes: No visual loss, blurred vision, double vision or yellow sclerae.No hearing loss, sneezing, congestion, runny nose or sore throat.  SKIN: No rash or itching.  CARDIOVASCULAR: per hpi RESPIRATORY: No shortness of breath, cough or sputum.  GASTROINTESTINAL: No anorexia, nausea, vomiting or diarrhea. No abdominal pain or  blood.  GENITOURINARY: No burning on urination, no polyuria NEUROLOGICAL: No headache, dizziness, syncope, paralysis, ataxia, numbness or tingling in the extremities. No change in bowel or bladder control.  MUSCULOSKELETAL: No muscle, back pain, joint pain or stiffness.  LYMPHATICS: No enlarged nodes. No history of splenectomy.  PSYCHIATRIC: No history of depression or anxiety.  ENDOCRINOLOGIC: No reports of sweating, cold or heat intolerance. No polyuria or polydipsia.  Marland Kitchen   Physical Examination Today's Vitals   02/01/20 1000  BP: 120/84  Pulse: (!) 59  SpO2: 93%  Weight: 252 lb (114.3 kg)  Height: 5\' 9"  (1.753 m)   Body mass index is 37.21 kg/m.  Gen: resting comfortably, no  acute distress HEENT: no scleral icterus, pupils equal round and reactive, no palptable cervical adenopathy,  CV: RRR, no m/r/g, no jvd Resp: Clear to auscultation bilaterally GI: abdomen is soft, non-tender, non-distended, normal bowel sounds, no hepatosplenomegaly MSK: extremities are warm, no edema.  Skin: warm, no rash Neuro:  no focal deficits Psych: appropriate affect     Assessment and Plan  1. CAD - no symptoms, doing well nearly 5 years our from CABG - continue current meds - EKG shows SR, RBBB/LAFB. No acute ischemic changes   2. HTN - at goal, continue current meds  3. Hyperlipidemia - request pcp labs, continue statin       Arnoldo Lenis, M.D.

## 2020-02-01 NOTE — Patient Instructions (Signed)

## 2020-02-09 ENCOUNTER — Other Ambulatory Visit: Payer: Self-pay | Admitting: *Deleted

## 2020-02-09 MED ORDER — ATORVASTATIN CALCIUM 40 MG PO TABS
40.0000 mg | ORAL_TABLET | Freq: Every day | ORAL | 1 refills | Status: DC
Start: 2020-02-09 — End: 2020-08-07

## 2020-03-06 DIAGNOSIS — Z2821 Immunization not carried out because of patient refusal: Secondary | ICD-10-CM | POA: Diagnosis not present

## 2020-03-06 DIAGNOSIS — Z7189 Other specified counseling: Secondary | ICD-10-CM | POA: Diagnosis not present

## 2020-03-06 DIAGNOSIS — Z299 Encounter for prophylactic measures, unspecified: Secondary | ICD-10-CM | POA: Diagnosis not present

## 2020-03-06 DIAGNOSIS — Z79899 Other long term (current) drug therapy: Secondary | ICD-10-CM | POA: Diagnosis not present

## 2020-03-06 DIAGNOSIS — Z125 Encounter for screening for malignant neoplasm of prostate: Secondary | ICD-10-CM | POA: Diagnosis not present

## 2020-03-06 DIAGNOSIS — E7800 Pure hypercholesterolemia, unspecified: Secondary | ICD-10-CM | POA: Diagnosis not present

## 2020-03-06 DIAGNOSIS — R5383 Other fatigue: Secondary | ICD-10-CM | POA: Diagnosis not present

## 2020-03-06 DIAGNOSIS — Z0000 Encounter for general adult medical examination without abnormal findings: Secondary | ICD-10-CM | POA: Diagnosis not present

## 2020-04-11 DIAGNOSIS — M549 Dorsalgia, unspecified: Secondary | ICD-10-CM | POA: Diagnosis not present

## 2020-04-11 DIAGNOSIS — Z299 Encounter for prophylactic measures, unspecified: Secondary | ICD-10-CM | POA: Diagnosis not present

## 2020-04-11 DIAGNOSIS — J449 Chronic obstructive pulmonary disease, unspecified: Secondary | ICD-10-CM | POA: Diagnosis not present

## 2020-04-11 DIAGNOSIS — F17210 Nicotine dependence, cigarettes, uncomplicated: Secondary | ICD-10-CM | POA: Diagnosis not present

## 2020-04-11 DIAGNOSIS — I10 Essential (primary) hypertension: Secondary | ICD-10-CM | POA: Diagnosis not present

## 2020-04-24 DIAGNOSIS — M48062 Spinal stenosis, lumbar region with neurogenic claudication: Secondary | ICD-10-CM | POA: Diagnosis not present

## 2020-04-24 DIAGNOSIS — I10 Essential (primary) hypertension: Secondary | ICD-10-CM | POA: Diagnosis not present

## 2020-08-07 ENCOUNTER — Other Ambulatory Visit: Payer: Self-pay | Admitting: Cardiology

## 2020-08-14 DIAGNOSIS — J449 Chronic obstructive pulmonary disease, unspecified: Secondary | ICD-10-CM | POA: Diagnosis not present

## 2020-08-14 DIAGNOSIS — I10 Essential (primary) hypertension: Secondary | ICD-10-CM | POA: Diagnosis not present

## 2020-08-14 DIAGNOSIS — I2510 Atherosclerotic heart disease of native coronary artery without angina pectoris: Secondary | ICD-10-CM | POA: Diagnosis not present

## 2020-08-14 DIAGNOSIS — Z299 Encounter for prophylactic measures, unspecified: Secondary | ICD-10-CM | POA: Diagnosis not present

## 2020-08-21 IMAGING — MR MR LUMBAR SPINE W/O CM
4 of 5 series · 15 of 48 positions shown · non-contrast
Comparison: None.

CLINICAL DATA: Spinal stenosis with neurogenic claudication. Back
pain.

EXAM:
MRI LUMBAR SPINE WITHOUT CONTRAST
TECHNIQUE: Multiplanar, multisequence MR imaging of the lumbar spine was
performed. No intravenous contrast was administered.

[Series 3: T2 · sagittal · 4.0mm · 0.70mm/px · 6 of 15 slices shown (1 of 2)]
[im 1/15]
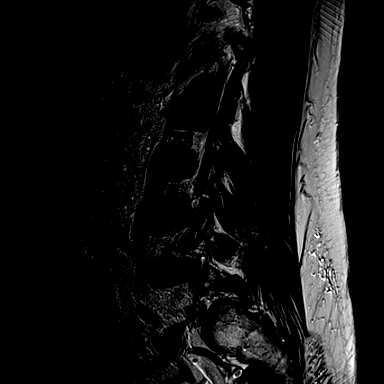
[im 3/15]
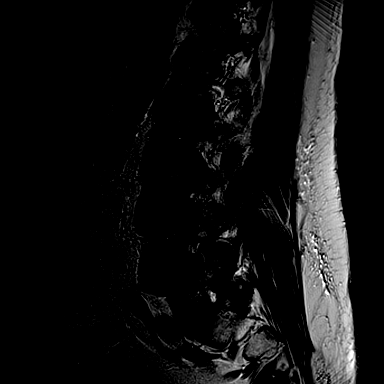
[im 6/15]
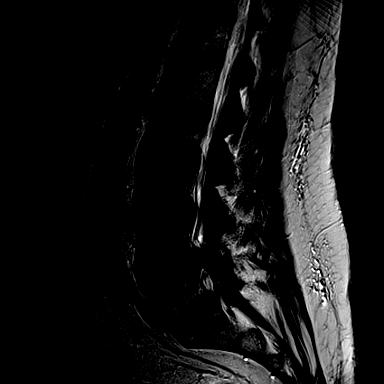
[im 9/15]
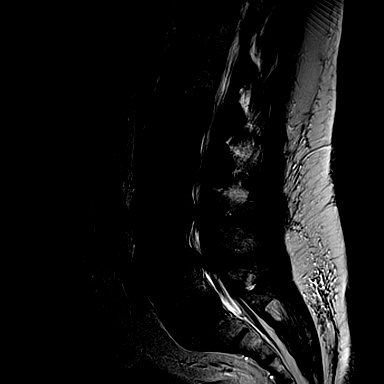
[im 12/15]
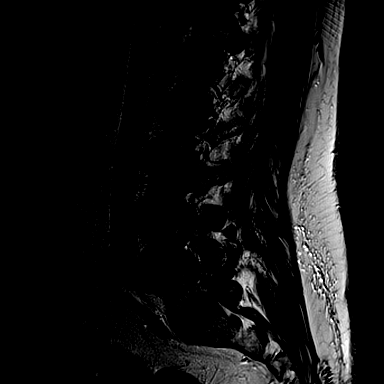
[im 15/15]
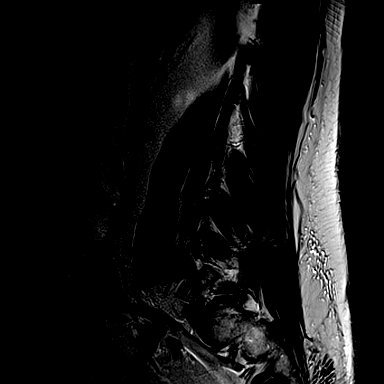

[Series 4: T1 · sagittal · 4.0mm · 0.35mm/px · 3 of 15 slices shown (1 of 2)]
[im 3/15]
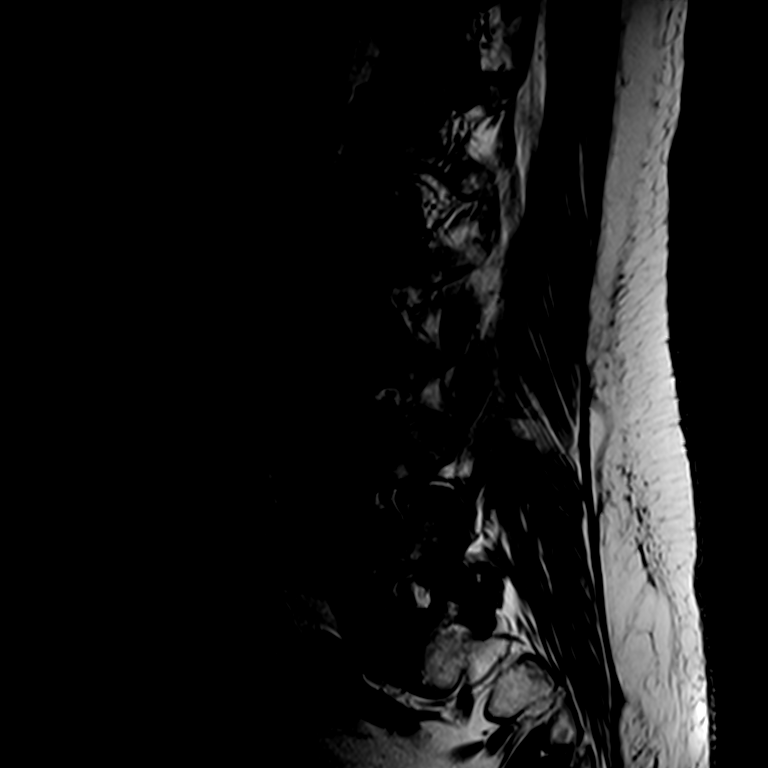
[im 9/15]
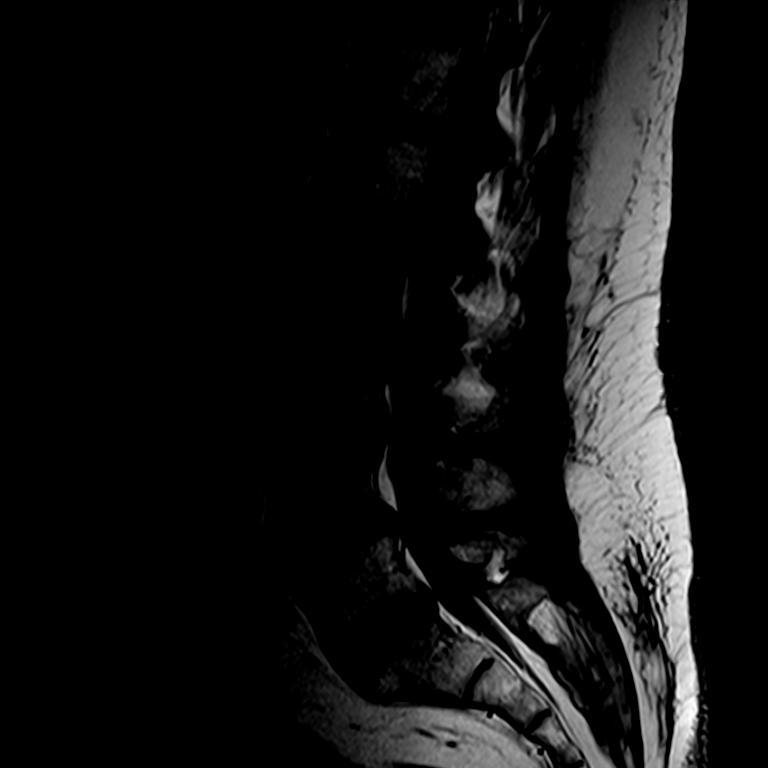
[im 15/15]
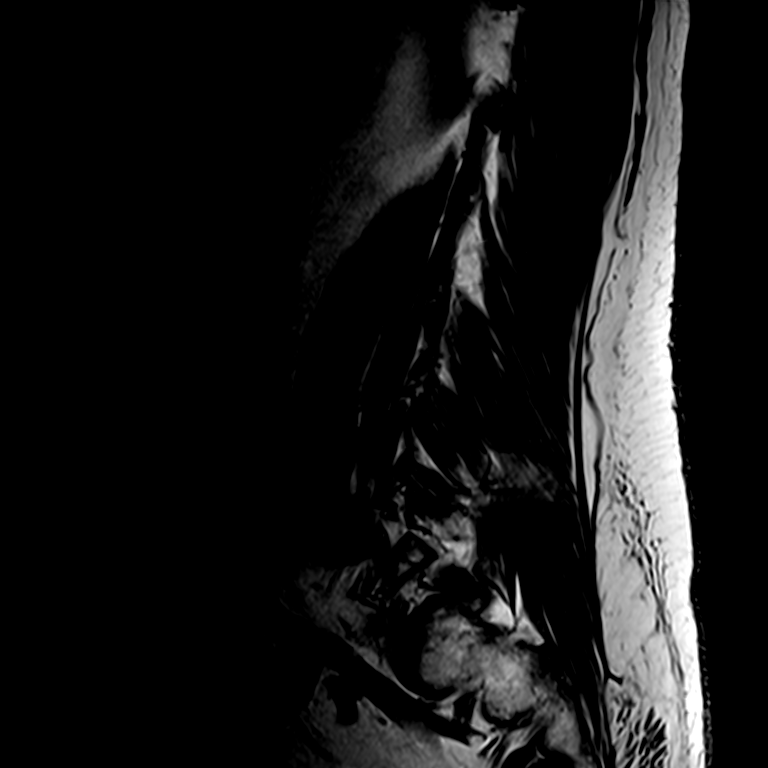

[Series 6: T2 · axial · 4.0mm · 0.25mm/px · z∈[-74,+72]mm · 3 of 39 slices shown (2 of 2)]
[im 6/39]
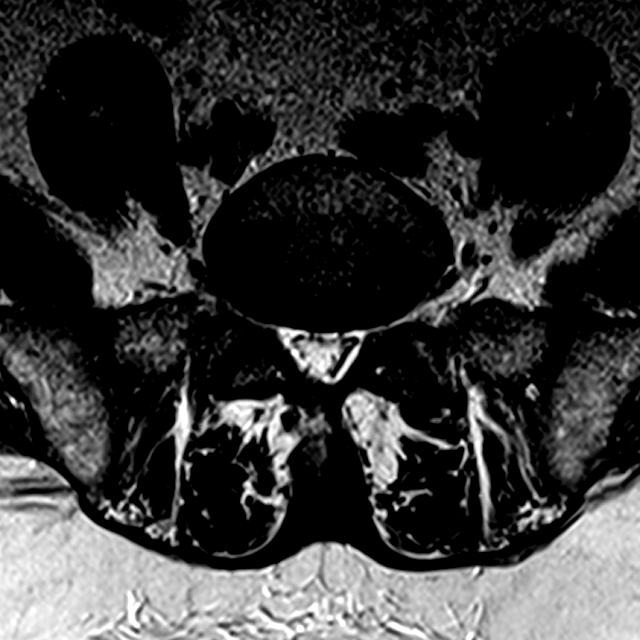
[im 20/39]
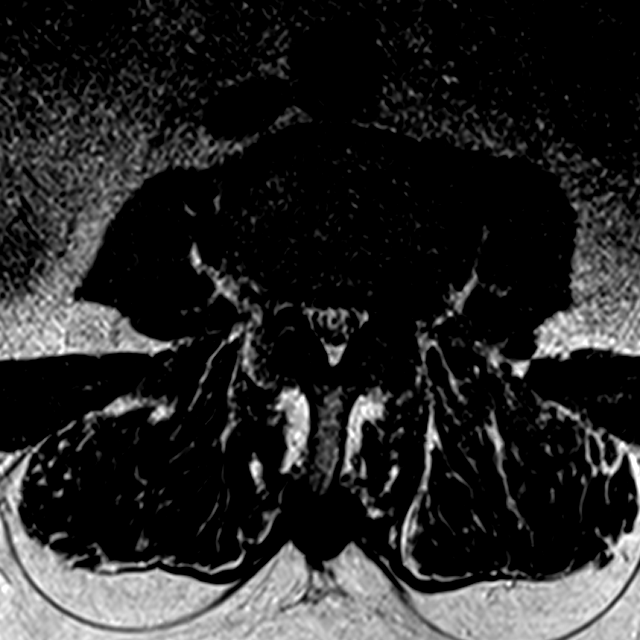
[im 33/39]
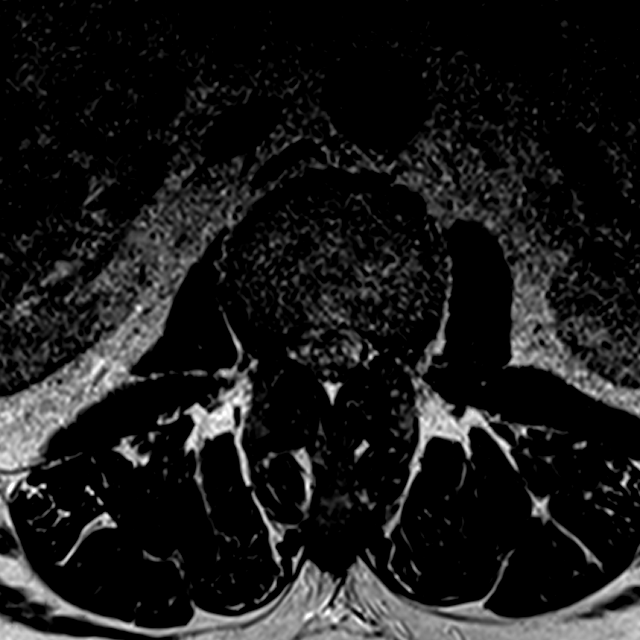

[Series 7: T1 · axial · 4.0mm · 0.25mm/px · z∈[-74,+72]mm · 3 of 39 slices shown (2 of 2)]
[im 6/39]
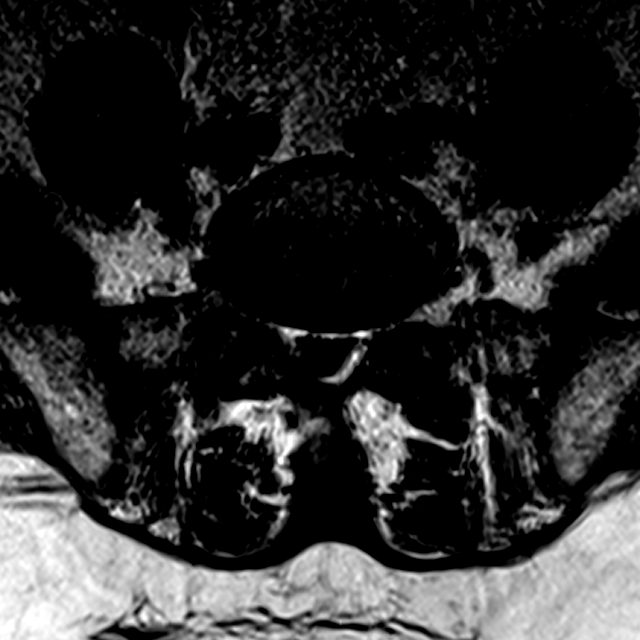
[im 20/39]
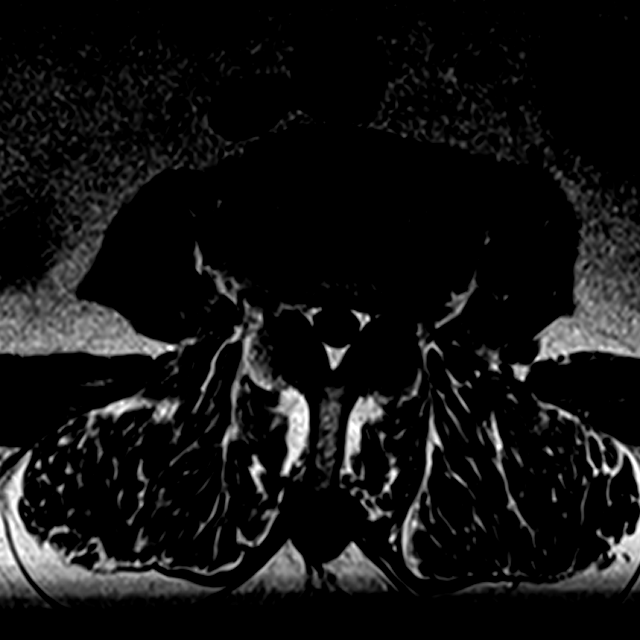
[im 33/39]
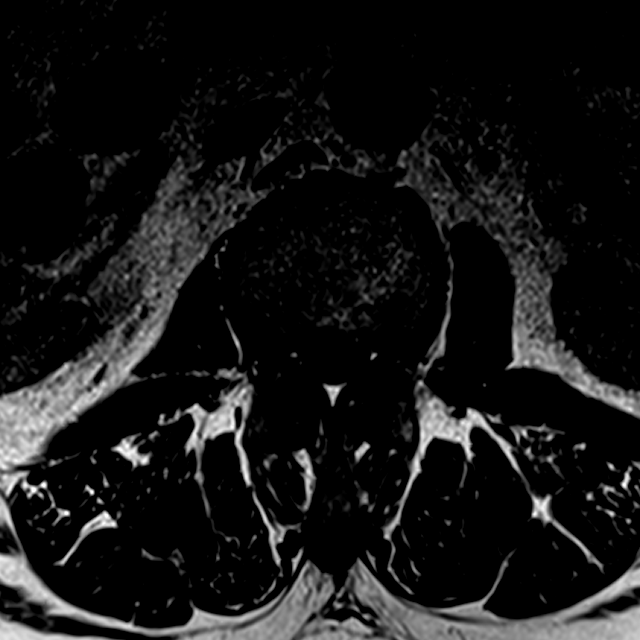

[15 of 48 positions shown; findings below may reference images not displayed]

FINDINGS: Segmentation:  Normal

Alignment:  Normal

Vertebrae: Normal bone marrow. Negative for fracture or mass. No
bone marrow edema.

Conus medullaris and cauda equina: Conus extends to the L1-2 level.
Conus and cauda equina appear normal.

Paraspinal and other soft tissues: Negative for paraspinous mass or
fluid collection

Disc levels:

Moderately severe congenital stenosis of the lumbar canal due to
short pedicles.

L1-2: Mild disc degeneration and mild spinal stenosis. Mild facet
degeneration

L2-3: Mild disc bulging and mild facet degeneration. Mild spinal
stenosis

L3-4: Diffuse disc bulging with broad-based left foraminal disc
protrusion. Moderate facet hypertrophy. Moderate spinal stenosis.
Moderate subarticular stenosis and foraminal stenosis on the left.
Mild to moderate right foraminal stenosis.

L4-5: Disc degeneration with broad-based left foraminal disc
protrusion. Severe facet and ligamentum flavum hypertrophy. Severe
spinal stenosis. Severe subarticular foraminal stenosis on the left
and moderate subarticular foraminal stenosis on the right

L5-S1: Disc and facet degeneration bilaterally. Mild subarticular
stenosis bilaterally.
IMPRESSION: Moderately severe congenital lumbar stenosis with acquired stenosis
at multiple levels due to disc and facet degeneration as described
above. This is most severe on the left at L3-4 and L4-5.

## 2020-11-01 DIAGNOSIS — M25532 Pain in left wrist: Secondary | ICD-10-CM | POA: Diagnosis not present

## 2020-11-01 DIAGNOSIS — F17210 Nicotine dependence, cigarettes, uncomplicated: Secondary | ICD-10-CM | POA: Diagnosis not present

## 2020-11-01 DIAGNOSIS — M25539 Pain in unspecified wrist: Secondary | ICD-10-CM | POA: Diagnosis not present

## 2020-11-01 DIAGNOSIS — M19032 Primary osteoarthritis, left wrist: Secondary | ICD-10-CM | POA: Diagnosis not present

## 2020-11-01 DIAGNOSIS — I10 Essential (primary) hypertension: Secondary | ICD-10-CM | POA: Diagnosis not present

## 2020-11-01 DIAGNOSIS — Z299 Encounter for prophylactic measures, unspecified: Secondary | ICD-10-CM | POA: Diagnosis not present

## 2020-11-01 DIAGNOSIS — M1812 Unilateral primary osteoarthritis of first carpometacarpal joint, left hand: Secondary | ICD-10-CM | POA: Diagnosis not present

## 2020-12-19 DIAGNOSIS — F17210 Nicotine dependence, cigarettes, uncomplicated: Secondary | ICD-10-CM | POA: Diagnosis not present

## 2020-12-19 DIAGNOSIS — Z2821 Immunization not carried out because of patient refusal: Secondary | ICD-10-CM | POA: Diagnosis not present

## 2020-12-19 DIAGNOSIS — M25532 Pain in left wrist: Secondary | ICD-10-CM | POA: Diagnosis not present

## 2020-12-19 DIAGNOSIS — R0602 Shortness of breath: Secondary | ICD-10-CM | POA: Diagnosis not present

## 2020-12-19 DIAGNOSIS — J811 Chronic pulmonary edema: Secondary | ICD-10-CM | POA: Diagnosis not present

## 2020-12-19 DIAGNOSIS — J449 Chronic obstructive pulmonary disease, unspecified: Secondary | ICD-10-CM | POA: Diagnosis not present

## 2020-12-19 DIAGNOSIS — M778 Other enthesopathies, not elsewhere classified: Secondary | ICD-10-CM | POA: Diagnosis not present

## 2020-12-19 DIAGNOSIS — I517 Cardiomegaly: Secondary | ICD-10-CM | POA: Diagnosis not present

## 2020-12-19 DIAGNOSIS — Z299 Encounter for prophylactic measures, unspecified: Secondary | ICD-10-CM | POA: Diagnosis not present

## 2020-12-19 DIAGNOSIS — I10 Essential (primary) hypertension: Secondary | ICD-10-CM | POA: Diagnosis not present

## 2020-12-23 DIAGNOSIS — R0602 Shortness of breath: Secondary | ICD-10-CM | POA: Diagnosis not present

## 2021-01-02 DIAGNOSIS — H905 Unspecified sensorineural hearing loss: Secondary | ICD-10-CM | POA: Diagnosis not present

## 2021-01-02 DIAGNOSIS — I10 Essential (primary) hypertension: Secondary | ICD-10-CM | POA: Diagnosis not present

## 2021-01-02 DIAGNOSIS — Z299 Encounter for prophylactic measures, unspecified: Secondary | ICD-10-CM | POA: Diagnosis not present

## 2021-01-02 DIAGNOSIS — J449 Chronic obstructive pulmonary disease, unspecified: Secondary | ICD-10-CM | POA: Diagnosis not present

## 2021-01-02 DIAGNOSIS — F17210 Nicotine dependence, cigarettes, uncomplicated: Secondary | ICD-10-CM | POA: Diagnosis not present

## 2021-02-21 ENCOUNTER — Other Ambulatory Visit (HOSPITAL_COMMUNITY): Payer: Self-pay | Admitting: Neurological Surgery

## 2021-02-21 ENCOUNTER — Other Ambulatory Visit: Payer: Self-pay | Admitting: Neurological Surgery

## 2021-02-21 DIAGNOSIS — M48062 Spinal stenosis, lumbar region with neurogenic claudication: Secondary | ICD-10-CM

## 2021-02-21 DIAGNOSIS — I10 Essential (primary) hypertension: Secondary | ICD-10-CM | POA: Diagnosis not present

## 2021-03-04 ENCOUNTER — Other Ambulatory Visit (HOSPITAL_COMMUNITY): Payer: Self-pay | Admitting: Neurological Surgery

## 2021-03-04 DIAGNOSIS — Z0189 Encounter for other specified special examinations: Secondary | ICD-10-CM

## 2021-03-04 DIAGNOSIS — M48062 Spinal stenosis, lumbar region with neurogenic claudication: Secondary | ICD-10-CM

## 2021-03-04 DIAGNOSIS — Z1389 Encounter for screening for other disorder: Secondary | ICD-10-CM

## 2021-03-07 DIAGNOSIS — Z79899 Other long term (current) drug therapy: Secondary | ICD-10-CM | POA: Diagnosis not present

## 2021-03-07 DIAGNOSIS — I10 Essential (primary) hypertension: Secondary | ICD-10-CM | POA: Diagnosis not present

## 2021-03-07 DIAGNOSIS — R5383 Other fatigue: Secondary | ICD-10-CM | POA: Diagnosis not present

## 2021-03-07 DIAGNOSIS — Z299 Encounter for prophylactic measures, unspecified: Secondary | ICD-10-CM | POA: Diagnosis not present

## 2021-03-07 DIAGNOSIS — Z0000 Encounter for general adult medical examination without abnormal findings: Secondary | ICD-10-CM | POA: Diagnosis not present

## 2021-03-07 DIAGNOSIS — Z7189 Other specified counseling: Secondary | ICD-10-CM | POA: Diagnosis not present

## 2021-03-07 DIAGNOSIS — E7800 Pure hypercholesterolemia, unspecified: Secondary | ICD-10-CM | POA: Diagnosis not present

## 2021-03-10 DIAGNOSIS — R5383 Other fatigue: Secondary | ICD-10-CM | POA: Diagnosis not present

## 2021-03-10 DIAGNOSIS — Z79899 Other long term (current) drug therapy: Secondary | ICD-10-CM | POA: Diagnosis not present

## 2021-03-10 DIAGNOSIS — E7800 Pure hypercholesterolemia, unspecified: Secondary | ICD-10-CM | POA: Diagnosis not present

## 2021-03-13 ENCOUNTER — Ambulatory Visit (HOSPITAL_COMMUNITY)
Admission: RE | Admit: 2021-03-13 | Discharge: 2021-03-13 | Disposition: A | Discharge status: Home / Self Care | Payer: Medicare Other | Source: Ambulatory Visit | Attending: Neurological Surgery | Admitting: Neurological Surgery

## 2021-03-13 ENCOUNTER — Other Ambulatory Visit: Payer: Self-pay

## 2021-03-13 DIAGNOSIS — M545 Low back pain, unspecified: Secondary | ICD-10-CM | POA: Diagnosis not present

## 2021-03-13 DIAGNOSIS — Z1389 Encounter for screening for other disorder: Secondary | ICD-10-CM | POA: Insufficient documentation

## 2021-03-13 DIAGNOSIS — M48062 Spinal stenosis, lumbar region with neurogenic claudication: Secondary | ICD-10-CM

## 2021-03-13 DIAGNOSIS — Z01818 Encounter for other preprocedural examination: Secondary | ICD-10-CM | POA: Diagnosis not present

## 2021-04-01 ENCOUNTER — Other Ambulatory Visit: Payer: Self-pay | Admitting: Neurological Surgery

## 2021-04-02 ENCOUNTER — Telehealth: Payer: Self-pay | Admitting: *Deleted

## 2021-04-02 NOTE — Telephone Encounter (Signed)
° °  Name: Jose Keith  DOB: Aug 05, 1950  MRN: [id number]  Primary Cardiologist: Carlyle Dolly, MD  Chart reviewed as part of pre-operative protocol coverage. Because of Jose Keith's past medical history and time since last visit, he will require a follow-up visit in order to better assess preoperative cardiovascular risk.  Pre-op covering staff: - Please schedule appointment and call patient to inform them. If patient already had an upcoming appointment within acceptable timeframe, please add "pre-op clearance" to the appointment notes so provider is aware. - Please contact requesting surgeon's office via preferred method (i.e, phone, fax) to inform them of need for appointment prior to surgery.  If applicable, this message will also be routed to pharmacy pool and/or primary cardiologist for input on holding anticoagulant/antiplatelet agent as requested below so that this information is available to the clearing provider at time of patient's appointment.   [place], [place]  04/02/2021, 10:04 AM

## 2021-04-02 NOTE — Telephone Encounter (Signed)
° °  Pre-operative Risk Assessment    Patient Name: Jose Keith  DOB: Aug 07, 1950 MRN: [id number]      Request for Surgical Clearance    Procedure:   L4-5 MINIMALLY INVASIVE LUMBAR DECOMPRESSION  Date of Surgery:  Clearance 04/15/21                                 Surgeon:  DR. Emelda Brothers Surgeon's Group or Practice Name:  [facility] Phone number:  [phone] Fax number:  [phone] ATTN: JESSICA   Type of Clearance Requested:   - Medical  - Pharmacy:  Hold Aspirin     Type of Anesthesia:  General    Additional requests/questions:    Jiles Prows   04/02/2021, 8:34 AM

## 2021-04-02 NOTE — Telephone Encounter (Signed)
LVM for pt to call back to schedule appt for preop.

## 2021-04-02 NOTE — Telephone Encounter (Signed)
Left message for the pt that he is going to need an appt for pre op clearance. I did not see anything available in [place] or [place] before the pt's procedure. Can offer the pt when pt calls back if appt 04/11/21 @ 1:55 with Laurann Montana, NP at the [facility] location. I will update the surgeon's office the pt will require an appt with the cardiologist.

## 2021-04-03 NOTE — Telephone Encounter (Signed)
Pt with hx of CAD prior CABG in Jan 2017 with LIMA-LAD, SVG-OM, SVG-PDA. Last seen 01/2020. Upcoming visit 04/07/21 for preop clearance. Okay to hold Aspirin prior to L4-5 minimally invasive lumbar decompression?  Thanks,  Loel Dubonnet, NP

## 2021-04-03 NOTE — Telephone Encounter (Signed)
S/w the pt's daughter [name],(DPR). Jose Keith has been informed that her dad will need an appt for pr eo clearance. I informed Jose Keith that we did not have anything in the [place] or [place] office. Jose Keith stated ok to come to [facility] for the pre op clearance. Appt has been made to see Laurann Montana, NP 04/07/21 @ 1:55 at the [facility] location. Jose Keith has been given the address and Ph # for [place] location.  I will forward notes to NP for upcoming appt. Will fax notes to surgeon's office the pt has appt 04/07/21.

## 2021-04-06 NOTE — Progress Notes (Signed)
Office Visit    Patient Name: Jose Keith Date of Encounter: 04/07/2021  PCP:  Monico Blitz, [place]  Cardiologist:  Carlyle Dolly, MD  Advanced Practice Provider:  No care team member to display Electrophysiologist:  None    Chief Complaint    Jose Keith is a 71 y.o. male with a hx of CAD, hypertension, hyperlipidemia, COPD, GERD, PE, and obesity presents today for annual follow-up appointment.  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 male with a hx of CAD s/p CABG 2017, hypertension, hyperlipidemia, COPD, GERD, PE, and obesity presents today for annual follow-up appointment last seen 02/01/2020 by Dr. Harl Bowie.  Patient is status post CABG in January 2017 with LIMA to LAD, SVG to OM, and SVG to PDA.  Echocardiogram January 2017 demonstrated mildly reduced LVEF 45 to 50% with distal septal and apical dyskinesis.  He was having infrequent chest pains the last time he was seen November 2021.  He was compliant with his hypertension medications and it was well controlled.  Today, he feels well without any cardiovascular complaints.  He has  not had any chest pain.  He has had shortness of breath which has been longstanding due to his COPD.  He did not notice any lower extremity swelling.  He did gain some weight since his last appointment which is due mostly to inactivity.  He is having a lot of back pain and occasionally his legs give out so he is worried about walking on a regular basis.  He is here to discuss preop clearance for back surgery that he is going to have done on January 24.  He states that they plan to make 1 inch incision with a go and with a drill and free up the bone that is compressing the nerve.  He is looking forward to some relief.  We went through the RCRI and he is considered at a 6.6% risk for a major cardiac event.  We also went through the Duke activity index which showed acceptable METS for cardiac clearance.  He states his biggest issue right now is his back pain.  He does not have any syncope or presyncope.  He does state that he had some lightheadedness that occurred in the morning with switching positions.  This is likely some orthostatic hypotension due to dehydration.  I discussed drinking more water and taking his blood pressure regularly.  This has not happened for a few months but it would be a good idea anyways to increase hydration.  Reports no chest pain, pressure, or tightness. No edema, orthopnea, PND. Reports no palpitations.     EKGs/Labs/Other Studies Reviewed:   The following studies were reviewed today: None.  EKG:  EKG is ordered today.  RBBB (consistent with previous EKG), sinus bradycardia, continue to monitor with periodic EKGs  Recent Labs: No results found for requested labs within last 8760 hours.  Recent Lipid Panel    Component Value Date/Time   CHOL 111 04/01/2017 0932   TRIG 152 (H) 04/01/2017 0932   HDL 29 (L) 04/01/2017 0932   CHOLHDL 3.8 04/01/2017 0932   LDLCALC 59 04/01/2017 0932     Home Medications   Current Meds  Medication Sig   aspirin EC 81 MG tablet Take  1 tablet (81 mg total) by mouth daily.   atorvastatin (LIPITOR) 40 MG tablet Take 1 tablet by mouth once daily   carvedilol (COREG) 6.25 MG tablet Take 1 tablet by mouth twice daily   gabapentin (NEURONTIN) 300 MG capsule Take 300 mg by mouth 2 (two) times daily.   naproxen sodium (ALEVE) 220 MG tablet Take 220 mg by mouth daily as needed (pain).   nitroGLYCERIN (NITROSTAT) 0.4 MG SL tablet Place 1 tablet (0.4 mg total) under the tongue every 5 (five) minutes as needed for chest pain.   traMADol (ULTRAM) 50 MG tablet Take 1 tablet (50 mg total) by mouth every 6 (six) hours as needed.   TRELEGY ELLIPTA 100-62.5-25 MCG/ACT AEPB Inhale 1 puff into the lungs daily.     Review of Systems      All other systems reviewed and are otherwise negative except as noted above.  Physical Exam    VS:  BP 124/64    Pulse (!) 58    Ht 5\' 9"  (1.753 m)    Wt 259 lb (117.5 kg)    SpO2 98%    BMI 38.25 kg/m  , BMI Body mass index is 38.25 kg/m.  Wt Readings from Last 3 Encounters:  04/07/21 259 lb (117.5 kg)  02/01/20 252 lb (114.3 kg)  02/01/19 251 lb (113.9 kg)     GEN: Well nourished, well developed, in no acute distress. HEENT: normal. Neck: Supple, no JVD, carotid bruits, or masses. Cardiac: RRR, no murmurs, rubs, or gallops. No clubbing, cyanosis, edema.  Radials/PT 2+ and equal bilaterally.  Respiratory:  Respirations regular and unlabored, clear to auscultation bilaterally. GI: Soft, nontender, nondistended. MS: No deformity or atrophy. Skin: Warm and dry, no rash. Neuro:  Strength and sensation are intact. Psych: Normal affect.  Assessment & Plan   Preop clearance [name] perioperative risk of a major cardiac event is 6.6% according to the Revised Cardiac Risk Index (RCRI).  Therefore, he is at high risk for perioperative complications.   His functional capacity is good at 5.62 METs according to the Duke Activity Status Index (DASI). Recommendations: According to ACC/AHA  guidelines, no further cardiovascular testing needed.  The patient may proceed to surgery at acceptable risk.   Antiplatelet and/or Anticoagulation Recommendations: Would prefer for patient to not have to hold ASA 81mg  but okay to hold for 7 days if surgeon deems necessary    CAD status post CABG 2017 -No current chest pain -Continue GDMT: statin, ASA 81mg , carvedilol, and PRN nitro  Hypertension -Currently well controlled today -Continue to monitor your BP at home -Continue current medication regimen -Continue low-sodium diet  Hyperlipidemia -Last lipid panel 02/2021 total cholesterol 111, HDL 29, LDL 59, triglycerides 152 -No labs needed today  COPD -stable, he has SOB which is chronic  Disposition: Follow up 1 year with Carlyle Dolly, MD or APP.  Signed, Elgie Collard, PA-C 04/07/2021, 2:33 PM [facility]

## 2021-04-07 ENCOUNTER — Encounter (HOSPITAL_BASED_OUTPATIENT_CLINIC_OR_DEPARTMENT_OTHER): Payer: Self-pay | Admitting: Physician Assistant

## 2021-04-07 ENCOUNTER — Other Ambulatory Visit: Payer: Self-pay

## 2021-04-07 ENCOUNTER — Ambulatory Visit (INDEPENDENT_AMBULATORY_CARE_PROVIDER_SITE_OTHER): Payer: 59 | Admitting: Physician Assistant

## 2021-04-07 VITALS — BP 124/64 | HR 58 | Ht 69.0 in | Wt 259.0 lb

## 2021-04-07 DIAGNOSIS — J42 Unspecified chronic bronchitis: Secondary | ICD-10-CM

## 2021-04-07 DIAGNOSIS — I25110 Atherosclerotic heart disease of native coronary artery with unstable angina pectoris: Secondary | ICD-10-CM

## 2021-04-07 DIAGNOSIS — I10 Essential (primary) hypertension: Secondary | ICD-10-CM | POA: Diagnosis not present

## 2021-04-07 DIAGNOSIS — E785 Hyperlipidemia, unspecified: Secondary | ICD-10-CM

## 2021-04-07 DIAGNOSIS — K219 Gastro-esophageal reflux disease without esophagitis: Secondary | ICD-10-CM

## 2021-04-07 NOTE — Patient Instructions (Signed)
Medication Instructions:  Continue your current medications.   *If you need a refill on your cardiac medications before your next appointment, please call your pharmacy*   Lab Work: None ordered today.   Testing/Procedures: None ordered today.   Follow-Up: At [facility], you and your health needs are our priority.  As part of our continuing mission to provide you with exceptional heart care, we have created designated Provider Care Teams.  These Care Teams include your primary Cardiologist (physician) and Advanced Practice Providers (APPs -  Physician Assistants and Nurse Practitioners) who all work together to provide you with the care you need, when you need it.  We recommend signing up for the patient portal called "MyChart".  Sign up information is provided on this After Visit Summary.  MyChart is used to connect with patients for Virtual Visits (Telemedicine).  Patients are able to view lab/test results, encounter notes, upcoming appointments, etc.  Non-urgent messages can be sent to your provider as well.   To learn more about what you can do with MyChart, go to [email or web address].    Your next appointment:   1 year(s)  The format for your next appointment:   In Person  Provider:   You may see Carlyle Dolly, MD or the Advanced Practice Provider    Other Instructions  Nicholes Rough, PA will send a note to your surgical team that you are appropriate for surgery. You may hold Aspirin at their direction.   Heart Healthy Diet Recommendations: A low-salt diet is recommended. Meats should be grilled, baked, or boiled. Avoid fried foods. Focus on lean protein sources like fish or chicken with vegetables and fruits. The American Heart Association is a Microbiologist!  American Heart Association Diet and Lifeystyle Recommendations    Exercise recommendations: The American Heart Association recommends 150 minutes of moderate intensity exercise weekly. Try 30 minutes of  moderate intensity exercise 4-5 times per week. This could include walking, jogging, or swimming.

## 2021-04-07 NOTE — Telephone Encounter (Signed)
Todays clinic note reviewed from cardiology PA, I agree with her assessment and plan   Zandra Abts MD

## 2021-04-10 NOTE — Progress Notes (Signed)
Surgical Instructions    Your procedure is scheduled on Tuesday, January 24th, 2023.   Report to [facility] Main Entrance "A" at 05:30 A.M., then check in with the Admitting office.  Call this number if you have problems the morning of surgery:  [phone]   If you have any questions prior to your surgery date call [phone]: Open Monday-Friday 8am-4pm    Remember:  Do not eat after midnight the night before your surgery  You may drink clear liquids until 04:30 the morning of your surgery.   Clear liquids allowed are: Water, Non-Citrus Juices (without pulp), Carbonated Beverages, Clear Tea, Black Coffee ONLY (NO MILK, CREAM OR POWDERED CREAMER of any kind), and Gatorade    Take these medicines the morning of surgery with A SIP OF WATER:  atorvastatin (LIPITOR) carvedilol (COREG)  gabapentin (NEURONTIN)  TRELEGY ELLIPTA  If needed:  nitroGLYCERIN (NITROSTAT) traMADol (ULTRAM)    Follow your surgeon's instructions on when to stop Aspirin.  If no instructions were given by your surgeon then you will need to call the office to get those instructions.     As of today, STOP taking any Aspirin (unless otherwise instructed by your surgeon) Aleve, Naproxen, Ibuprofen, Motrin, Advil, Goody's, BC's, all herbal medications, fish oil, and all vitamins.   After your COVID test   You are not required to quarantine however you are required to wear a well-fitting mask when you are out and around people not in your household.  If your mask becomes wet or soiled, replace with a new one.  Wash your hands often with soap and water for 20 seconds or clean your hands with an alcohol-based hand sanitizer that contains at least 60% alcohol.  Do not share personal items.  Notify your provider: if you are in close contact with someone who has COVID  or if you develop a fever of 100.4 or greater, sneezing, cough, sore throat, shortness of breath or body aches.      The day of surgery:         Do not wear jewelry  Do not wear lotions, powders, colognes, or deodorant. Men may shave face and neck. Do not bring valuables to the hospital.              [facility] is not responsible for any belongings or valuables.  Do NOT Smoke (Tobacco/Vaping)  24 hours prior to your procedure  If you use a CPAP at night, you may bring your mask for your overnight stay.   Contacts, glasses, hearing aids, dentures or partials may not be worn into surgery, please bring cases for these belongings   For patients admitted to the hospital, discharge time will be determined by your treatment team.   Patients discharged the day of surgery will not be allowed to drive home, and someone needs to stay with them for 24 hours.  NO VISITORS WILL BE ALLOWED IN PRE-OP WHERE PATIENTS ARE PREPPED FOR SURGERY.  ONLY 1 SUPPORT PERSON MAY BE PRESENT IN THE WAITING ROOM WHILE YOU ARE IN SURGERY.  IF YOU ARE TO BE ADMITTED, ONCE YOU ARE IN YOUR ROOM YOU WILL BE ALLOWED TWO (2) VISITORS. 1 (ONE) VISITOR MAY STAY OVERNIGHT BUT MUST ARRIVE TO THE ROOM BY 8pm.  Minor children may have two parents present. Special consideration for safety and communication needs will be reviewed on a case by case basis.  Special instructions:    Oral Hygiene is also important to reduce your risk of infection.  Remember - BRUSH YOUR TEETH THE MORNING OF SURGERY WITH YOUR REGULAR TOOTHPASTE   - Preparing For Surgery  Before surgery, you can play an important role. Because skin is not sterile, your skin needs to be as free of germs as possible. You can reduce the number of germs on your skin by washing with CHG (chlorahexidine gluconate) Soap before surgery.  CHG is an antiseptic cleaner which kills germs and bonds with the skin to continue killing germs even after washing.     Please do not use if you have an allergy to CHG or antibacterial soaps. If your skin becomes reddened/irritated stop using the CHG.  Do not shave  (including legs and underarms) for at least 48 hours prior to first CHG shower. It is OK to shave your face.  Please follow these instructions carefully.     Shower the NIGHT BEFORE SURGERY and the MORNING OF SURGERY with CHG Soap.   If you chose to wash your hair, wash your hair first as usual with your normal shampoo. After you shampoo, rinse your hair and body thoroughly to remove the shampoo.  Then [employer] and genitals (private parts) with your normal soap and rinse thoroughly to remove soap.  After that Use CHG Soap as you would any other liquid soap. You can apply CHG directly to the skin and wash gently with a scrungie or a clean washcloth.   Apply the CHG Soap to your body ONLY FROM THE NECK DOWN.  Do not use on open wounds or open sores. Avoid contact with your eyes, ears, mouth and genitals (private parts). Wash Face and genitals (private parts)  with your normal soap.   Wash thoroughly, paying special attention to the area where your surgery will be performed.  Thoroughly rinse your body with warm water from the neck down.  DO NOT shower/wash with your normal soap after using and rinsing off the CHG Soap.  Pat yourself dry with a CLEAN TOWEL.  Wear CLEAN PAJAMAS to bed the night before surgery  Place CLEAN SHEETS on your bed the night before your surgery  DO NOT SLEEP WITH PETS.   Day of Surgery:  Take a shower with CHG soap. Wear Clean/Comfortable clothing the morning of surgery Do not apply any deodorants/lotions.   Remember to brush your teeth WITH YOUR REGULAR TOOTHPASTE.   Please read over the following fact sheets that you were given.

## 2021-04-11 ENCOUNTER — Encounter (HOSPITAL_COMMUNITY)
Admission: RE | Admit: 2021-04-11 | Discharge: 2021-04-11 | Disposition: A | Discharge status: Home / Self Care | Payer: 59 | Source: Ambulatory Visit | Attending: Neurological Surgery | Admitting: Neurological Surgery

## 2021-04-11 ENCOUNTER — Other Ambulatory Visit: Payer: Self-pay

## 2021-04-11 ENCOUNTER — Encounter (HOSPITAL_COMMUNITY): Payer: Self-pay

## 2021-04-11 VITALS — BP 141/75 | HR 58 | Temp 98.0°F | Resp 18 | Ht 68.0 in | Wt 262.0 lb

## 2021-04-11 DIAGNOSIS — Z01812 Encounter for preprocedural laboratory examination: Secondary | ICD-10-CM | POA: Insufficient documentation

## 2021-04-11 DIAGNOSIS — Z7951 Long term (current) use of inhaled steroids: Secondary | ICD-10-CM | POA: Diagnosis not present

## 2021-04-11 DIAGNOSIS — Z20822 Contact with and (suspected) exposure to covid-19: Secondary | ICD-10-CM | POA: Diagnosis not present

## 2021-04-11 DIAGNOSIS — E785 Hyperlipidemia, unspecified: Secondary | ICD-10-CM | POA: Diagnosis not present

## 2021-04-11 DIAGNOSIS — J449 Chronic obstructive pulmonary disease, unspecified: Secondary | ICD-10-CM | POA: Diagnosis not present

## 2021-04-11 DIAGNOSIS — I2510 Atherosclerotic heart disease of native coronary artery without angina pectoris: Secondary | ICD-10-CM | POA: Diagnosis not present

## 2021-04-11 DIAGNOSIS — I10 Essential (primary) hypertension: Secondary | ICD-10-CM | POA: Insufficient documentation

## 2021-04-11 DIAGNOSIS — Z951 Presence of aortocoronary bypass graft: Secondary | ICD-10-CM | POA: Insufficient documentation

## 2021-04-11 DIAGNOSIS — Z7982 Long term (current) use of aspirin: Secondary | ICD-10-CM | POA: Insufficient documentation

## 2021-04-11 DIAGNOSIS — Z01818 Encounter for other preprocedural examination: Secondary | ICD-10-CM

## 2021-04-11 HISTORY — DX: Pneumonia, unspecified organism: J18.9

## 2021-04-11 LAB — CBC
HCT: 53.6 % — ABNORMAL HIGH (ref 39.0–52.0)
Hemoglobin: 17.5 g/dL — ABNORMAL HIGH (ref 13.0–17.0)
MCH: 30.9 pg (ref 26.0–34.0)
MCHC: 32.6 g/dL (ref 30.0–36.0)
MCV: 94.5 fL (ref 80.0–100.0)
Platelets: 177 10*3/uL (ref 150–400)
RBC: 5.67 MIL/uL (ref 4.22–5.81)
RDW: 14.5 % (ref 11.5–15.5)
WBC: 7.4 10*3/uL (ref 4.0–10.5)
nRBC: 0 % (ref 0.0–0.2)

## 2021-04-11 LAB — BASIC METABOLIC PANEL
Anion gap: 7 (ref 5–15)
BUN: 17 mg/dL (ref 8–23)
CO2: 26 mmol/L (ref 22–32)
Calcium: 9.4 mg/dL (ref 8.9–10.3)
Chloride: 105 mmol/L (ref 98–111)
Creatinine, Ser: 0.95 mg/dL (ref 0.61–1.24)
GFR, Estimated: >60 mL/min (ref >60)
Glucose, Bld: 130 mg/dL — ABNORMAL HIGH (ref 70–99)
Potassium: 4.7 mmol/L (ref 3.5–5.1)
Sodium: 138 mmol/L (ref 135–145)

## 2021-04-11 LAB — SURGICAL PCR SCREEN
MRSA, PCR: NEGATIVE
Staphylococcus aureus: NEGATIVE

## 2021-04-11 LAB — SARS CORONAVIRUS 2 (TAT 6-24 HRS): SARS Coronavirus 2: NEGATIVE

## 2021-04-11 NOTE — Progress Notes (Signed)
PCP - Weldon Picking shah Cardiologist - Carlyle Dolly  PPM/ICD - denies   Chest x-ray - 12/19/20 EKG - 04/07/21 Stress Test - denies ECHO - 03/2015 Cardiac Cath - 03/27/15  Sleep Study - denies     Follow your surgeon's instructions on when to stop Aspirin.  If no instructions were given by your surgeon then you will need to call the office to get those instructions.      As of today, STOP taking any  (unless otherwise instructed by your surgeon) Aleve, Naproxen, Ibuprofen, Motrin, Advil, Goody's, BC's, all herbal medications, fish oil, and all vitamins.  ERAS Protcol -yes PRE-SURGERY Ensure or G2- not ordered  COVID TEST- 04/11/21 in PAT   Anesthesia review: yes, cardiac history   Patient denies shortness of breath, fever, cough and chest pain at PAT appointment   All instructions explained to the patient, with a verbal understanding of the material. Patient agrees to go over the instructions while at home for a better understanding. Patient also instructed to self quarantine after being tested for COVID-19. The opportunity to ask questions was provided.

## 2021-04-11 NOTE — Progress Notes (Signed)
Surgical Instructions    Your procedure is scheduled on Tuesday, January 24th, 2023.   Report to [facility] Main Entrance "A" at 05:30 A.M., then check in with the Admitting office.  Call this number if you have problems the morning of surgery:  [phone]   If you have any questions prior to your surgery date call [phone]: Open Monday-Friday 8am-4pm    Remember:  Do not eat after midnight the night before your surgery  You may drink clear liquids until 04:30 the morning of your surgery.   Clear liquids allowed are: Water, Non-Citrus Juices (without pulp), Carbonated Beverages, Clear Tea, Black Coffee ONLY (NO MILK, CREAM OR POWDERED CREAMER of any kind), and Gatorade    Take these medicines the morning of surgery with A SIP OF WATER:  atorvastatin (LIPITOR) carvedilol (COREG)  gabapentin (NEURONTIN)  TRELEGY ELLIPTA  If needed:  nitroGLYCERIN (NITROSTAT) traMADol (ULTRAM)    Follow your surgeon's instructions on when to stop Aspirin.  If no instructions were given by your surgeon then you will need to call the office to get those instructions.     As of today, STOP taking any  (unless otherwise instructed by your surgeon) Aleve, Naproxen, Ibuprofen, Motrin, Advil, Goody's, BC's, all herbal medications, fish oil, and all vitamins.   After your COVID test   You are not required to quarantine however you are required to wear a well-fitting mask when you are out and around people not in your household.  If your mask becomes wet or soiled, replace with a new one.  Wash your hands often with soap and water for 20 seconds or clean your hands with an alcohol-based hand sanitizer that contains at least 60% alcohol.  Do not share personal items.  Notify your provider: if you are in close contact with someone who has COVID  or if you develop a fever of 100.4 or greater, sneezing, cough, sore throat, shortness of breath or body aches.      The day of surgery:        Do  not wear jewelry  Do not wear lotions, powders, colognes, or deodorant. Men may shave face and neck. Do not bring valuables to the hospital.              [facility] is not responsible for any belongings or valuables.  Do NOT Smoke (Tobacco/Vaping)  24 hours prior to your procedure  If you use a CPAP at night, you may bring your mask for your overnight stay.   Contacts, glasses, hearing aids, dentures or partials may not be worn into surgery, please bring cases for these belongings   For patients admitted to the hospital, discharge time will be determined by your treatment team.   Patients discharged the day of surgery will not be allowed to drive home, and someone needs to stay with them for 24 hours.  NO VISITORS WILL BE ALLOWED IN PRE-OP WHERE PATIENTS ARE PREPPED FOR SURGERY.  ONLY 1 SUPPORT PERSON MAY BE PRESENT IN THE WAITING ROOM WHILE YOU ARE IN SURGERY.  IF YOU ARE TO BE ADMITTED, ONCE YOU ARE IN YOUR ROOM YOU WILL BE ALLOWED TWO (2) VISITORS. 1 (ONE) VISITOR MAY STAY OVERNIGHT BUT MUST ARRIVE TO THE ROOM BY 8pm.  Minor children may have two parents present. Special consideration for safety and communication needs will be reviewed on a case by case basis.  Special instructions:    Oral Hygiene is also important to reduce your risk of infection.  Remember - BRUSH YOUR TEETH THE MORNING OF SURGERY WITH YOUR REGULAR TOOTHPASTE   [place]- Preparing For Surgery  Before surgery, you can play an important role. Because skin is not sterile, your skin needs to be as free of germs as possible. You can reduce the number of germs on your skin by washing with CHG (chlorahexidine gluconate) Soap before surgery.  CHG is an antiseptic cleaner which kills germs and bonds with the skin to continue killing germs even after washing.     Please do not use if you have an allergy to CHG or antibacterial soaps. If your skin becomes reddened/irritated stop using the CHG.  Do not shave (including  legs and underarms) for at least 48 hours prior to first CHG shower. It is OK to shave your face.  Please follow these instructions carefully.     Shower the NIGHT BEFORE SURGERY and the MORNING OF SURGERY with CHG Soap.   If you chose to wash your hair, wash your hair first as usual with your normal shampoo. After you shampoo, rinse your hair and body thoroughly to remove the shampoo.  Then [employer] and genitals (private parts) with your normal soap and rinse thoroughly to remove soap.  After that Use CHG Soap as you would any other liquid soap. You can apply CHG directly to the skin and wash gently with a scrungie or a clean washcloth.   Apply the CHG Soap to your body ONLY FROM THE NECK DOWN.  Do not use on open wounds or open sores. Avoid contact with your eyes, ears, mouth and genitals (private parts). Wash Face and genitals (private parts)  with your normal soap.   Wash thoroughly, paying special attention to the area where your surgery will be performed.  Thoroughly rinse your body with warm water from the neck down.  DO NOT shower/wash with your normal soap after using and rinsing off the CHG Soap.  Pat yourself dry with a CLEAN TOWEL.  Wear CLEAN PAJAMAS to bed the night before surgery  Place CLEAN SHEETS on your bed the night before your surgery  DO NOT SLEEP WITH PETS.   Day of Surgery:  Take a shower with CHG soap. Wear Clean/Comfortable clothing the morning of surgery Do not apply any deodorants/lotions.   Remember to brush your teeth WITH YOUR REGULAR TOOTHPASTE.   Please read over the following fact sheets that you were given.

## 2021-04-14 NOTE — Anesthesia Preprocedure Evaluation (Addendum)
Anesthesia Evaluation  Patient identified by MRN, date of birth, ID band Patient awake    Reviewed: Allergy & Precautions, H&P , NPO status , Patient's Chart, lab work & pertinent test results, reviewed documented beta blocker date and time   Airway Mallampati: III  TM Distance: >3 FB Neck ROM: Full    Dental no notable dental hx. (+) Edentulous Upper, Edentulous Lower, Dental Advisory Given   Pulmonary COPD,  COPD inhaler, Current SmokerPatient did not abstain from smoking., PE   Pulmonary exam normal breath sounds clear to auscultation       Cardiovascular hypertension, Pt. on medications and Pt. on home beta blockers + CAD, + Past MI, + CABG and +CHF   Rhythm:Regular Rate:Normal     Neuro/Psych negative neurological ROS  negative psych ROS   GI/Hepatic Neg liver ROS, GERD  ,  Endo/Other  Morbid obesity  Renal/GU negative Renal ROS  negative genitourinary   Musculoskeletal  (+) Arthritis , Osteoarthritis,    Abdominal   Peds  Hematology negative hematology ROS (+)   Anesthesia Other Findings   Reproductive/Obstetrics negative OB ROS                           Anesthesia Physical Anesthesia Plan  ASA: 3  Anesthesia Plan: General   Post-op Pain Management: Tylenol PO (pre-op)   Induction:   PONV Risk Score and Plan: 2 and Ondansetron and Dexamethasone  Airway Management Planned: Oral ETT  Additional Equipment: Arterial line  Intra-op Plan:   Post-operative Plan: Extubation in OR  Informed Consent: I have reviewed the patients History and Physical, chart, labs and discussed the procedure including the risks, benefits and alternatives for the proposed anesthesia with the patient or authorized representative who has indicated his/her understanding and acceptance.     Dental advisory given  Plan Discussed with: Anesthesiologist and CRNA  Anesthesia Plan Comments: (PAT note by  Karoline Caldwell, PA-C:  Follows with cardiology for history of CAD s/p CABG 03/29/2015 at [place], HTN, HLD, PE. Echocardiogram January 2017 demonstrated mildly reduced LVEF 45 to 50% with distal septal and apical dyskinesis.   Last seen by Nicholes Rough, PA-C 04/07/2021 for preop evaluation.  Per note, "Mr.Andalon's perioperative risk of a major cardiac event is6.6% according to the Revised Cardiac Risk Index (RCRI). Therefore, heis at highrisk for perioperative complications. Hisfunctional capacity is goodat5.62METs according to the Duke Activity Status Index (DASI). Recommendations: According to ACC/AHA guidelines, no further cardiovascular testing needed. The patient may proceed to surgery at acceptable risk. Antiplatelet and/or Anticoagulation Recommendations: Would prefer for patient to not have to hold ASA 81mg  but okay to hold for 7 days if surgeon deems necessary."  History of COPD maintained on Trelegy.  Preop labs reviewed, unremarkable.  EKG 04/07/2021: Sinus bradycardia.  Rate 58.  Right bundle branch block.  Left anterior fascicular block.  No significant change compared to prior.  TTE 04/10/2015 (Care Everywhere): Interpretation Summary  A limited portable two-dimensional transthoracic echocardiogram with  Spectral Doppler was performed. Definity contrast injection performed. This  limited 2D study shows LVEF 45-50% with distal septal and apical  dyskinesis. 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 Discussed with Dr. Truman Hayward who has admitted  patient. Consider cardiac MRI for further clarification.    )      Anesthesia Quick Evaluation

## 2021-04-14 NOTE — Progress Notes (Signed)
Anesthesia Chart Review:  Follows with cardiology for history of CAD s/p CABG 03/29/2015 at [place], HTN, HLD, PE. Echocardiogram January 2017 demonstrated mildly reduced LVEF 45 to 50% with distal septal and apical dyskinesis.   Last seen by Nicholes Rough, PA-C 04/07/2021 for preop evaluation.  Per note, "Jose Keith's perioperative risk of a major cardiac event is 6.6% according to the Revised Cardiac Risk Index (RCRI).  Therefore, he is at high risk for perioperative complications.   His functional capacity is good at 5.62 METs according to the Duke Activity Status Index (DASI). Recommendations: According to ACC/AHA guidelines, no further cardiovascular testing needed.  The patient may proceed to surgery at acceptable risk.  Antiplatelet and/or Anticoagulation Recommendations: Would prefer for patient to not have to hold ASA 81mg  but okay to hold for 7 days if surgeon deems necessary."  History of COPD maintained on Trelegy.  Preop labs reviewed, unremarkable.  EKG 04/07/2021: Sinus bradycardia.  Rate 58.  Right bundle branch block.  Left anterior fascicular block.  No significant change compared to prior.  TTE 04/10/2015 (Care Everywhere): Interpretation Summary  A limited portable two-dimensional transthoracic echocardiogram with  Spectral Doppler was performed. Definity contrast injection performed. This  limited 2D study shows LVEF 45-50% with distal septal and apical  dyskinesis. 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 Discussed with Dr. Truman Hayward who has admitted  patient. Consider cardiac MRI for further clarification.      Wynonia Musty [facility] Short Stay Center/Anesthesiology Phone [phone] 04/14/2021 9:10 AM

## 2021-04-15 ENCOUNTER — Other Ambulatory Visit: Payer: Self-pay

## 2021-04-15 ENCOUNTER — Encounter (HOSPITAL_COMMUNITY)
Admission: RE | Disposition: A | Discharge status: Home / Self Care | Payer: Self-pay | Source: Home / Self Care | Attending: Neurological Surgery

## 2021-04-15 ENCOUNTER — Encounter (HOSPITAL_COMMUNITY): Payer: Self-pay | Admitting: Neurological Surgery

## 2021-04-15 ENCOUNTER — Ambulatory Visit (HOSPITAL_COMMUNITY): Payer: 59

## 2021-04-15 ENCOUNTER — Ambulatory Visit (HOSPITAL_COMMUNITY): Payer: 59 | Admitting: Physician Assistant

## 2021-04-15 ENCOUNTER — Observation Stay (HOSPITAL_COMMUNITY)
Admission: RE | Admit: 2021-04-15 | Discharge: 2021-04-16 | Disposition: A | Discharge status: Home / Self Care | Payer: 59 | Attending: Neurological Surgery | Admitting: Neurological Surgery

## 2021-04-15 ENCOUNTER — Ambulatory Visit (HOSPITAL_COMMUNITY): Payer: 59 | Admitting: Anesthesiology

## 2021-04-15 DIAGNOSIS — I110 Hypertensive heart disease with heart failure: Secondary | ICD-10-CM | POA: Insufficient documentation

## 2021-04-15 DIAGNOSIS — Z419 Encounter for procedure for purposes other than remedying health state, unspecified: Secondary | ICD-10-CM

## 2021-04-15 DIAGNOSIS — F17200 Nicotine dependence, unspecified, uncomplicated: Secondary | ICD-10-CM | POA: Insufficient documentation

## 2021-04-15 DIAGNOSIS — I2510 Atherosclerotic heart disease of native coronary artery without angina pectoris: Secondary | ICD-10-CM | POA: Insufficient documentation

## 2021-04-15 DIAGNOSIS — I509 Heart failure, unspecified: Secondary | ICD-10-CM | POA: Diagnosis not present

## 2021-04-15 DIAGNOSIS — J449 Chronic obstructive pulmonary disease, unspecified: Secondary | ICD-10-CM | POA: Insufficient documentation

## 2021-04-15 DIAGNOSIS — Z86711 Personal history of pulmonary embolism: Secondary | ICD-10-CM | POA: Diagnosis not present

## 2021-04-15 DIAGNOSIS — M48062 Spinal stenosis, lumbar region with neurogenic claudication: Principal | ICD-10-CM | POA: Diagnosis present

## 2021-04-15 HISTORY — PX: LUMBAR LAMINECTOMY/ DECOMPRESSION WITH MET-RX: SHX5959

## 2021-04-15 SURGERY — LUMBAR LAMINECTOMY/ DECOMPRESSION WITH MET-RX
Anesthesia: General | Site: Back

## 2021-04-15 MED ORDER — PROPOFOL 10 MG/ML IV BOLUS
INTRAVENOUS | Status: AC
  Filled 2021-04-15: qty 20

## 2021-04-15 MED ORDER — SODIUM CHLORIDE 0.9 % IV SOLN
250.0000 mL | INTRAVENOUS | Status: DC
  Administered 2021-04-15: 14:00:00 250 mL via INTRAVENOUS

## 2021-04-15 MED ORDER — MOMETASONE FURO-FORMOTEROL FUM 100-5 MCG/ACT IN AERO
2.0000 | INHALATION_SPRAY | Freq: Two times a day (BID) | RESPIRATORY_TRACT | Status: DC
  Administered 2021-04-15 – 2021-04-16 (×2): 2 via RESPIRATORY_TRACT
  Filled 2021-04-15: qty 8.8

## 2021-04-15 MED ORDER — LIDOCAINE-EPINEPHRINE 1 %-1:100000 IJ SOLN
INTRAMUSCULAR | Status: DC | PRN
  Administered 2021-04-15: 10 mL

## 2021-04-15 MED ORDER — LIDOCAINE 2% (20 MG/ML) 5 ML SYRINGE
INTRAMUSCULAR | Status: DC | PRN
  Administered 2021-04-15: 60 mg via INTRAVENOUS

## 2021-04-15 MED ORDER — LACTATED RINGERS IV SOLN: INTRAVENOUS | Status: DC

## 2021-04-15 MED ORDER — CELECOXIB 200 MG PO CAPS: 200.0000 mg | ORAL_CAPSULE | Freq: Once | ORAL | Status: AC

## 2021-04-15 MED ORDER — EPHEDRINE SULFATE-NACL 50-0.9 MG/10ML-% IV SOSY
PREFILLED_SYRINGE | INTRAVENOUS | Status: DC | PRN
  Administered 2021-04-15 (×2): 5 mg via INTRAVENOUS

## 2021-04-15 MED ORDER — CHLORHEXIDINE GLUCONATE CLOTH 2 % EX PADS: 6.0000 | MEDICATED_PAD | Freq: Once | CUTANEOUS | Status: DC

## 2021-04-15 MED ORDER — MENTHOL 3 MG MT LOZG: 1.0000 | LOZENGE | OROMUCOSAL | Status: DC | PRN

## 2021-04-15 MED ORDER — ACETAMINOPHEN 650 MG RE SUPP: 650.0000 mg | RECTAL | Status: DC | PRN

## 2021-04-15 MED ORDER — PHENYLEPHRINE 40 MCG/ML (10ML) SYRINGE FOR IV PUSH (FOR BLOOD PRESSURE SUPPORT)
PREFILLED_SYRINGE | INTRAVENOUS | Status: DC | PRN
Start: 2021-04-15 — End: 2021-04-15
  Administered 2021-04-15 (×4): 80 ug via INTRAVENOUS

## 2021-04-15 MED ORDER — CHLORHEXIDINE GLUCONATE 0.12 % MT SOLN
15.0000 mL | Freq: Once | OROMUCOSAL | Status: AC
  Administered 2021-04-15: 07:00:00 15 mL via OROMUCOSAL
  Filled 2021-04-15: qty 15

## 2021-04-15 MED ORDER — ALBUTEROL SULFATE HFA 108 (90 BASE) MCG/ACT IN AERS
INHALATION_SPRAY | RESPIRATORY_TRACT | Status: DC | PRN
Start: 2021-04-15 — End: 2021-04-15
  Administered 2021-04-15 (×2): 2 via RESPIRATORY_TRACT

## 2021-04-15 MED ORDER — SODIUM CHLORIDE 0.9% FLUSH: 3.0000 mL | INTRAVENOUS | Status: DC | PRN

## 2021-04-15 MED ORDER — FENTANYL CITRATE (PF) 250 MCG/5ML IJ SOLN
INTRAMUSCULAR | Status: AC
  Filled 2021-04-15: qty 5

## 2021-04-15 MED ORDER — THROMBIN 5000 UNITS EX SOLR
CUTANEOUS | Status: AC
  Filled 2021-04-15: qty 5000

## 2021-04-15 MED ORDER — NITROGLYCERIN 0.4 MG SL SUBL: 0.4000 mg | SUBLINGUAL_TABLET | SUBLINGUAL | Status: DC | PRN

## 2021-04-15 MED ORDER — PHENOL 1.4 % MT LIQD: 1.0000 | OROMUCOSAL | Status: DC | PRN

## 2021-04-15 MED ORDER — ROCURONIUM BROMIDE 10 MG/ML (PF) SYRINGE
PREFILLED_SYRINGE | INTRAVENOUS | Status: AC
  Filled 2021-04-15: qty 10

## 2021-04-15 MED ORDER — PHENYLEPHRINE 40 MCG/ML (10ML) SYRINGE FOR IV PUSH (FOR BLOOD PRESSURE SUPPORT)
PREFILLED_SYRINGE | INTRAVENOUS | Status: AC
  Filled 2021-04-15: qty 10

## 2021-04-15 MED ORDER — MIDAZOLAM HCL 2 MG/2ML IJ SOLN
INTRAMUSCULAR | Status: AC
  Filled 2021-04-15: qty 2

## 2021-04-15 MED ORDER — THROMBIN 5000 UNITS EX SOLR: OROMUCOSAL | Status: DC | PRN

## 2021-04-15 MED ORDER — CARVEDILOL 6.25 MG PO TABS
6.2500 mg | ORAL_TABLET | Freq: Two times a day (BID) | ORAL | Status: DC
  Administered 2021-04-15 – 2021-04-16 (×2): 6.25 mg via ORAL
  Filled 2021-04-15 (×2): qty 1

## 2021-04-15 MED ORDER — POLYETHYLENE GLYCOL 3350 17 G PO PACK: 17.0000 g | PACK | Freq: Every day | ORAL | Status: DC | PRN

## 2021-04-15 MED ORDER — ONDANSETRON HCL 4 MG/2ML IJ SOLN
INTRAMUSCULAR | Status: DC | PRN
Start: 2021-04-15 — End: 2021-04-15
  Administered 2021-04-15: 4 mg via INTRAVENOUS

## 2021-04-15 MED ORDER — LIDOCAINE 2% (20 MG/ML) 5 ML SYRINGE
INTRAMUSCULAR | Status: AC
  Filled 2021-04-15: qty 5

## 2021-04-15 MED ORDER — CEFAZOLIN SODIUM-DEXTROSE 2-4 GM/100ML-% IV SOLN
2.0000 g | INTRAVENOUS | Status: AC
  Administered 2021-04-15: 08:00:00 2 g via INTRAVENOUS
  Filled 2021-04-15: qty 100

## 2021-04-15 MED ORDER — PROPOFOL 10 MG/ML IV BOLUS
INTRAVENOUS | Status: DC | PRN
Start: 2021-04-15 — End: 2021-04-15
  Administered 2021-04-15: 13 mg via INTRAVENOUS

## 2021-04-15 MED ORDER — SODIUM CHLORIDE 0.9% FLUSH
3.0000 mL | Freq: Two times a day (BID) | INTRAVENOUS | Status: DC
  Administered 2021-04-15: 14:00:00 3 mL via INTRAVENOUS

## 2021-04-15 MED ORDER — CYCLOBENZAPRINE HCL 10 MG PO TABS
10.0000 mg | ORAL_TABLET | Freq: Three times a day (TID) | ORAL | Status: DC | PRN
  Administered 2021-04-15: 22:00:00 10 mg via ORAL
  Filled 2021-04-15: qty 1

## 2021-04-15 MED ORDER — SUCCINYLCHOLINE CHLORIDE 200 MG/10ML IV SOSY
PREFILLED_SYRINGE | INTRAVENOUS | Status: DC | PRN
Start: 2021-04-15 — End: 2021-04-15
  Administered 2021-04-15: 120 mg via INTRAVENOUS

## 2021-04-15 MED ORDER — ATORVASTATIN CALCIUM 40 MG PO TABS
40.0000 mg | ORAL_TABLET | Freq: Every day | ORAL | Status: DC
  Administered 2021-04-15 – 2021-04-16 (×2): 40 mg via ORAL
  Filled 2021-04-15 (×2): qty 1

## 2021-04-15 MED ORDER — GABAPENTIN 300 MG PO CAPS
300.0000 mg | ORAL_CAPSULE | Freq: Two times a day (BID) | ORAL | Status: DC
  Administered 2021-04-15 – 2021-04-16 (×2): 300 mg via ORAL
  Filled 2021-04-15 (×2): qty 1

## 2021-04-15 MED ORDER — ACETAMINOPHEN 325 MG PO TABS
650.0000 mg | ORAL_TABLET | ORAL | Status: DC | PRN
  Administered 2021-04-15 – 2021-04-16 (×2): 650 mg via ORAL
  Filled 2021-04-15 (×2): qty 2

## 2021-04-15 MED ORDER — FLUTICASONE-UMECLIDIN-VILANT 100-62.5-25 MCG/ACT IN AEPB: 1.0000 | INHALATION_SPRAY | Freq: Every day | RESPIRATORY_TRACT | Status: DC

## 2021-04-15 MED ORDER — ACETAMINOPHEN 500 MG PO TABS
ORAL_TABLET | ORAL | Status: AC
  Administered 2021-04-15: 06:00:00 1000 mg via ORAL
  Filled 2021-04-15: qty 2

## 2021-04-15 MED ORDER — AMISULPRIDE (ANTIEMETIC) 5 MG/2ML IV SOLN: 10.0000 mg | Freq: Once | INTRAVENOUS | Status: DC | PRN

## 2021-04-15 MED ORDER — OXYCODONE HCL 5 MG PO TABS: 5.0000 mg | ORAL_TABLET | ORAL | Status: DC | PRN

## 2021-04-15 MED ORDER — ONDANSETRON HCL 4 MG PO TABS: 4.0000 mg | ORAL_TABLET | Freq: Four times a day (QID) | ORAL | Status: DC | PRN

## 2021-04-15 MED ORDER — PROMETHAZINE HCL 25 MG/ML IJ SOLN: 6.2500 mg | INTRAMUSCULAR | Status: DC | PRN

## 2021-04-15 MED ORDER — OXYCODONE HCL 5 MG PO TABS
10.0000 mg | ORAL_TABLET | ORAL | Status: DC | PRN
  Administered 2021-04-15 – 2021-04-16 (×5): 10 mg via ORAL
  Filled 2021-04-15 (×5): qty 2

## 2021-04-15 MED ORDER — DOCUSATE SODIUM 100 MG PO CAPS
100.0000 mg | ORAL_CAPSULE | Freq: Two times a day (BID) | ORAL | Status: DC
  Administered 2021-04-15 – 2021-04-16 (×3): 100 mg via ORAL
  Filled 2021-04-15 (×3): qty 1

## 2021-04-15 MED ORDER — FENTANYL CITRATE (PF) 100 MCG/2ML IJ SOLN: 25.0000 ug | INTRAMUSCULAR | Status: DC | PRN

## 2021-04-15 MED ORDER — DEXAMETHASONE SODIUM PHOSPHATE 10 MG/ML IJ SOLN
INTRAMUSCULAR | Status: AC
  Filled 2021-04-15: qty 1

## 2021-04-15 MED ORDER — SUGAMMADEX SODIUM 200 MG/2ML IV SOLN
INTRAVENOUS | Status: DC | PRN
  Administered 2021-04-15: 250 mg via INTRAVENOUS

## 2021-04-15 MED ORDER — HYDROMORPHONE HCL 1 MG/ML IJ SOLN: 1.0000 mg | INTRAMUSCULAR | Status: DC | PRN

## 2021-04-15 MED ORDER — ACETAMINOPHEN 500 MG PO TABS: 1000.0000 mg | ORAL_TABLET | Freq: Once | ORAL | Status: AC

## 2021-04-15 MED ORDER — UMECLIDINIUM BROMIDE 62.5 MCG/ACT IN AEPB
1.0000 | INHALATION_SPRAY | Freq: Every day | RESPIRATORY_TRACT | Status: DC
  Administered 2021-04-16: 08:00:00 1 via RESPIRATORY_TRACT
  Filled 2021-04-15: qty 7

## 2021-04-15 MED ORDER — 0.9 % SODIUM CHLORIDE (POUR BTL) OPTIME
TOPICAL | Status: DC | PRN
  Administered 2021-04-15: 09:00:00 1000 mL

## 2021-04-15 MED ORDER — CEFAZOLIN SODIUM-DEXTROSE 2-4 GM/100ML-% IV SOLN
2.0000 g | Freq: Three times a day (TID) | INTRAVENOUS | Status: AC
  Administered 2021-04-15 (×2): 2 g via INTRAVENOUS
  Filled 2021-04-15 (×2): qty 100

## 2021-04-15 MED ORDER — ONDANSETRON HCL 4 MG/2ML IJ SOLN
INTRAMUSCULAR | Status: AC
  Filled 2021-04-15: qty 2

## 2021-04-15 MED ORDER — ROCURONIUM BROMIDE 10 MG/ML (PF) SYRINGE
PREFILLED_SYRINGE | INTRAVENOUS | Status: DC | PRN
  Administered 2021-04-15: 10 mg via INTRAVENOUS
  Administered 2021-04-15: 50 mg via INTRAVENOUS

## 2021-04-15 MED ORDER — CELECOXIB 200 MG PO CAPS
ORAL_CAPSULE | ORAL | Status: AC
  Administered 2021-04-15: 06:00:00 200 mg via ORAL
  Filled 2021-04-15: qty 1

## 2021-04-15 MED ORDER — FENTANYL CITRATE (PF) 250 MCG/5ML IJ SOLN
INTRAMUSCULAR | Status: DC | PRN
  Administered 2021-04-15 (×2): 50 ug via INTRAVENOUS

## 2021-04-15 MED ORDER — DEXAMETHASONE SODIUM PHOSPHATE 10 MG/ML IJ SOLN
INTRAMUSCULAR | Status: DC | PRN
Start: 2021-04-15 — End: 2021-04-15
  Administered 2021-04-15: 10 mg via INTRAVENOUS

## 2021-04-15 MED ORDER — ORAL CARE MOUTH RINSE: 15.0000 mL | Freq: Once | OROMUCOSAL | Status: AC

## 2021-04-15 MED ORDER — ONDANSETRON HCL 4 MG/2ML IJ SOLN: 4.0000 mg | Freq: Four times a day (QID) | INTRAMUSCULAR | Status: DC | PRN

## 2021-04-15 MED ORDER — PHENYLEPHRINE HCL-NACL 20-0.9 MG/250ML-% IV SOLN
INTRAVENOUS | Status: DC | PRN
  Administered 2021-04-15: 40 ug/min via INTRAVENOUS

## 2021-04-15 MED ORDER — LIDOCAINE-EPINEPHRINE 1 %-1:100000 IJ SOLN
INTRAMUSCULAR | Status: AC
  Filled 2021-04-15: qty 1

## 2021-04-15 SURGICAL SUPPLY — 49 items
BAG COUNTER SPONGE SURGICOUNT (BAG) ×2 IMPLANT
BAG SURGICOUNT SPONGE COUNTING (BAG) ×1
BAND RUBBER #18 3X1/16 STRL (MISCELLANEOUS) ×6 IMPLANT
BLADE CLIPPER SURG (BLADE) IMPLANT
BLADE SURG 11 STRL SS (BLADE) ×3 IMPLANT
BUR PRECISION FLUTE 5.0 (BURR) IMPLANT
BUR PRECISION MATCH 3.0 13 (BURR) ×2 IMPLANT
BUR PRECISION MATCH 3.0 13CM (BURR) ×1
CANISTER SUCT 3000ML PPV (MISCELLANEOUS) ×3 IMPLANT
DECANTER SPIKE VIAL GLASS SM (MISCELLANEOUS) ×3 IMPLANT
DERMABOND ADVANCED (GAUZE/BANDAGES/DRESSINGS) ×2
DERMABOND ADVANCED .7 DNX12 (GAUZE/BANDAGES/DRESSINGS) ×1 IMPLANT
DRAPE C-ARM 42X72 X-RAY (DRAPES) ×6 IMPLANT
DRAPE LAPAROTOMY 100X72X124 (DRAPES) ×3 IMPLANT
DRAPE MICROSCOPE LEICA (MISCELLANEOUS) ×5 IMPLANT
DRAPE SURG 17X23 STRL (DRAPES) ×3 IMPLANT
DURAPREP 26ML APPLICATOR (WOUND CARE) ×3 IMPLANT
ELECT BLADE 6.5 EXT (BLADE) ×3 IMPLANT
ELECT REM PT RETURN 9FT ADLT (ELECTROSURGICAL) ×3
ELECTRODE REM PT RTRN 9FT ADLT (ELECTROSURGICAL) ×1 IMPLANT
GAUZE 4X4 16PLY ~~LOC~~+RFID DBL (SPONGE) IMPLANT
GAUZE SPONGE 4X4 12PLY STRL (GAUZE/BANDAGES/DRESSINGS) IMPLANT
GLOVE EXAM NITRILE LRG STRL (GLOVE) IMPLANT
GLOVE EXAM NITRILE XL STR (GLOVE) IMPLANT
GLOVE EXAM NITRILE XS STR PU (GLOVE) IMPLANT
GLOVE SURG LTX SZ7.5 (GLOVE) ×3 IMPLANT
GLOVE SURG UNDER POLY LF SZ7.5 (GLOVE) ×3 IMPLANT
GOWN STRL REUS W/ TWL LRG LVL3 (GOWN DISPOSABLE) ×2 IMPLANT
GOWN STRL REUS W/ TWL XL LVL3 (GOWN DISPOSABLE) IMPLANT
GOWN STRL REUS W/TWL 2XL LVL3 (GOWN DISPOSABLE) IMPLANT
GOWN STRL REUS W/TWL LRG LVL3 (GOWN DISPOSABLE) ×4
GOWN STRL REUS W/TWL XL LVL3 (GOWN DISPOSABLE)
HEMOSTAT POWDER KIT SURGIFOAM (HEMOSTASIS) ×3 IMPLANT
KIT BASIN OR (CUSTOM PROCEDURE TRAY) ×3 IMPLANT
KIT TURNOVER KIT B (KITS) ×3 IMPLANT
NDL SPNL 18GX3.5 QUINCKE PK (NEEDLE) ×1 IMPLANT
NEEDLE HYPO 22GX1.5 SAFETY (NEEDLE) ×3 IMPLANT
NEEDLE SPNL 18GX3.5 QUINCKE PK (NEEDLE) ×3 IMPLANT
NS IRRIG 1000ML POUR BTL (IV SOLUTION) ×3 IMPLANT
PACK LAMINECTOMY NEURO (CUSTOM PROCEDURE TRAY) ×3 IMPLANT
PAD ARMBOARD 7.5X6 YLW CONV (MISCELLANEOUS) ×9 IMPLANT
SPONGE T-LAP 4X18 ~~LOC~~+RFID (SPONGE) IMPLANT
SUT MNCRL AB 3-0 PS2 18 (SUTURE) IMPLANT
SUT VIC AB 0 CT1 18XCR BRD8 (SUTURE) IMPLANT
SUT VIC AB 0 CT1 8-18 (SUTURE)
SUT VIC AB 2-0 CP2 18 (SUTURE) ×3 IMPLANT
TOWEL GREEN STERILE (TOWEL DISPOSABLE) ×3 IMPLANT
TOWEL GREEN STERILE FF (TOWEL DISPOSABLE) ×3 IMPLANT
WATER STERILE IRR 1000ML POUR (IV SOLUTION) ×3 IMPLANT

## 2021-04-15 NOTE — Anesthesia Procedure Notes (Signed)
Procedure Name: Intubation Date/Time: 04/15/2021 7:39 AM Performed by: Gaylene Brooks, CRNA Pre-anesthesia Checklist: Patient identified, Emergency Drugs available, Suction available and Patient being monitored Patient Re-evaluated:Patient Re-evaluated prior to induction Oxygen Delivery Method: Circle System Utilized Preoxygenation: Pre-oxygenation with 100% oxygen Induction Type: IV induction Laryngoscope Size: Miller and 2 Grade View: Grade II Tube type: Oral Tube size: 7.5 mm Number of attempts: 1 Airway Equipment and Method: Stylet and Oral airway Placement Confirmation: ETT inserted through vocal cords under direct vision, positive ETCO2 and breath sounds checked- equal and bilateral Secured at: 22 cm Tube secured with: Tape Dental Injury: Teeth and Oropharynx as per pre-operative assessment

## 2021-04-15 NOTE — Progress Notes (Signed)
A line d/c at this time patient tolerated procedure well pressure held to area and pressure dressing in place dry and intact

## 2021-04-15 NOTE — Anesthesia Postprocedure Evaluation (Signed)
Anesthesia Post Note  Patient: Jose Keith  Procedure(s) Performed: Lumbar four-Lumbar five MIS Metrx decompression (Back)     Patient location during evaluation: PACU Anesthesia Type: General Level of consciousness: awake and alert Pain management: pain level controlled Vital Signs Assessment: post-procedure vital signs reviewed and stable Respiratory status: spontaneous breathing, nonlabored ventilation, respiratory function stable and patient connected to nasal cannula oxygen Cardiovascular status: blood pressure returned to baseline and stable Postop Assessment: no apparent nausea or vomiting Anesthetic complications: no   No notable events documented.  Last Vitals:  Vitals:   04/15/21 1125 04/15/21 1207  BP: 112/64 (!) 119/59  Pulse: 62 60  Resp: 17 18  Temp:  36.7 C  SpO2: 97% 93%    Last Pain:  Vitals:   04/15/21 1207  TempSrc: Oral  PainSc:                  [name],W. EDMOND

## 2021-04-15 NOTE — H&P (Signed)
Surgical H&P Update  HPI: 71 y.o. with a history of lumbar stenosis with neurogenic claudication. Workup showed severe L4-5 canal stenosis with a mild static spondylolisthesis. His symptoms were unfortunately refractory to non-surgical treatment, I therefore recommended an MIS decompression. No changes in health since they were last seen. Still having the above and wishes to proceed with surgery.  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 He started smoking about 55 years ago. He has been smoking an average of .5 packs per day. He has never used smokeless tobacco. He reports that he does not drink alcohol and does not use drugs.  Physical Exam: Strength 5/5 x4 and SILTx4 except bilateral stocking distribution numbness  Assesment/Plan: 71 y.o. man with severe L4-5 canal stenosis and neurogenic claudication, here for MIS decompression. Risks, benefits, and alternatives discussed and the patient would like to continue with surgery.  -OR today -3C post-op  Judith Part, MD 04/15/21 7:14 AM

## 2021-04-15 NOTE — Anesthesia Procedure Notes (Signed)
Arterial Line Insertion Start/End1/24/2023 7:10 AM, 04/15/2021 7:20 AM Performed by: Gaylene Brooks, CRNA, CRNA  Patient location: Pre-op. Preanesthetic checklist: patient identified, IV checked, site marked, risks and benefits discussed, surgical consent, monitors and equipment checked, pre-op evaluation, timeout performed and anesthesia consent Lidocaine 1% used for infiltration Right, radial was placed Catheter size: 20 G Hand hygiene performed  and maximum sterile barriers used   Attempts: 1 Procedure performed without using ultrasound guided technique. Following insertion, Biopatch and dressing applied. Post procedure assessment: normal  Patient tolerated the procedure well with no immediate complications.

## 2021-04-15 NOTE — Transfer of Care (Signed)
Immediate Anesthesia Transfer of Care Note  Patient: Jose Keith  Procedure(s) Performed: Lumbar four-Lumbar five MIS Metrx decompression (Back)  Patient Location: PACU  Anesthesia Type:General  Level of Consciousness: awake, alert  and oriented  Airway & Oxygen Therapy: Patient Spontanous Breathing and Patient connected to face mask oxygen  Post-op Assessment: Report given to RN, Post -op Vital signs reviewed and stable and Patient moving all extremities X 4  Post vital signs: Reviewed and stable  Last Vitals:  Vitals Value Taken Time  BP 94/79 04/15/21 0953  Temp    Pulse 64 04/15/21 0953  Resp 20 04/15/21 0953  SpO2 94 % 04/15/21 0953    Last Pain:  Vitals:   04/15/21 0626  TempSrc:   PainSc: 5       Patients Stated Pain Goal: 3 (16/83/72 9021)  Complications: No notable events documented.

## 2021-04-15 NOTE — Op Note (Signed)
PATIENT: Jose Keith  DAY OF SURGERY: 04/15/21   PRE-OPERATIVE DIAGNOSIS:  Lumbar stenosis with neurogenic claudication   POST-OPERATIVE DIAGNOSIS:  Lumbar stenosis with neurogenic claudication   PROCEDURE:  Minimally invasive L4-5 laminectomy   SURGEON:  Surgeon(s) and Role:    Judith Part, MD - Primary   ANESTHESIA: ETGA   BRIEF HISTORY: This is a 71 year old man who presented with symptoms of neurogenic claudication. The patient was found to have L4-5 central canal stenosis. His symptoms were refractory to nonsurgical treatment measures, I therefore recommended a minimally invasive L4-5 laminectomy. This was discussed with the patient as well as risks, benefits, and alternatives and the patient wished to proceed with surgical treatment.   OPERATIVE DETAIL: The patient was taken to the operating room and placed on the OR table in the prone position. A formal time out was performed with two patient identifiers and confirmed the operative site. Anesthesia was induced by the anesthesia team. The operative site was marked, hair was clipped with surgical clippers, the area was then prepped and draped in a sterile fashion. Fluoroscopy was used to identify the surgical level with a spinal needle.   A 2cm incision was then marked 1cm off to the left of midline. The fascia was incised sharply and serial dilators were docked to the spinolaminar interface on L4. A final tubular distractor was placed and secured to the table and fluoro again confirmed the correct surgical level. The spinous process, lamina, and facet locations were confirmed for orientation. A high speed drill was then used to perform a 4 laminotomy until the insertion of the ligamentum flavum was identified superiorly. This was then extended to complete the  hemilaminotomy by extending medially to the midline and laterally to the lateral insertion of the ligamentum flavum. The table was tilted and tubular dilator was adjusted  to look across midline. An 'over the top' decompression was then performed by dissecting the ligamentum flavum off of the spinous process and lamina and identifying its insertion superiorly on the lamina and laterally at the facet. A #9 suction was then used to protect the thecal sac while the contralateral lamina was removed. With drilling complete, the ligamentum flavum was then completely resected from its insertion and dura was palpated in all directions to confirm good decompression. Hemostasis was obtained, the wound was copiously irrigated, and the tube was removed while using the microscope to confirm hemostasis of the muscle edges. All instrument and sponge counts were correct and the incision was then closed in layers. The patient was then returned to anesthesia for emergence. No apparent complications at the completion of the procedure.   EBL:  30mL   DRAINS: none   SPECIMENS: none   Judith Part, MD 04/15/21 7:51 AM

## 2021-04-16 ENCOUNTER — Encounter (HOSPITAL_COMMUNITY): Payer: Self-pay | Admitting: Neurological Surgery

## 2021-04-16 DIAGNOSIS — M48062 Spinal stenosis, lumbar region with neurogenic claudication: Secondary | ICD-10-CM | POA: Diagnosis not present

## 2021-04-16 MED ORDER — OXYCODONE HCL 5 MG PO TABS: 5.0000 mg | ORAL_TABLET | ORAL | 0 refills | Status: AC | PRN

## 2021-04-16 MED ORDER — CYCLOBENZAPRINE HCL 10 MG PO TABS: 10.0000 mg | ORAL_TABLET | Freq: Three times a day (TID) | ORAL | 0 refills | Status: AC | PRN

## 2021-04-23 NOTE — Evaluation (Signed)
Physical Therapy Evaluation and Discharge Patient Details Name: Jose Keith MRN: [id number] DOB: 1950-10-16 Today's Date: 2021-04-19  History of Present Illness  Jose Keith is a 71 y.o. male who underswent lumbar four-Lumbar five MIS Metrx decompression 1/24. PMHx: arthritis, CAD, CHF, COPD, GER, heart attack '05,  HLD, HTN  Clinical Impression  Patient evaluated by Physical Therapy with no further acute PT needs identified. SpO2 88% on RA at rest; required 4L O2 to maintain > 90% during walk. Pt ambulating 400 feet with no assistive device or physical assist. Required one standing rest break. Reports improved back pain post op. Education provided regarding activity recommendations and progression. All education has been completed and the patient has no further questions. No follow-up Physical Therapy or equipment needs. PT is signing off. Thank you for this referral.      Recommendations for follow up therapy are one component of a multi-disciplinary discharge planning process, led by the attending physician.  Recommendations may be updated based on patient status, additional functional criteria and insurance authorization.  Follow Up Recommendations No PT follow up    Assistance Recommended at Discharge PRN  Patient can return home with the following       Equipment Recommendations None recommended by PT  Recommendations for Other Services       Functional Status Assessment Patient has had a recent decline in their functional status and demonstrates the ability to make significant improvements in function in a reasonable and predictable amount of time.     Precautions / Restrictions Precautions Precautions: Back;Fall Precaution Booklet Issued: Yes (comment) Precaution Comments: verbally reviewed back precautions Required Braces or Orthoses:  (no brace required per orders) Restrictions Weight Bearing Restrictions: No      Mobility  Bed Mobility Overal bed  mobility: Modified Independent             General bed mobility comments: HOB elevated    Transfers Overall transfer level: Modified independent Equipment used: None                    Ambulation/Gait Ambulation/Gait assistance: Modified independent (Device/Increase time) Gait Distance (Feet): 400 Feet Assistive device: None Gait Pattern/deviations: Wide base of support, Decreased stride length Gait velocity: decreased     General Gait Details: Pt requiring one standing rest break, overall steady  Stairs            Wheelchair Mobility    Modified Rankin (Stroke Patients Only)       Balance Overall balance assessment: Needs assistance Sitting-balance support: Feet supported Sitting balance-Leahy Scale: Good     Standing balance support: No upper extremity supported, During functional activity Standing balance-Leahy Scale: Fair                               Pertinent Vitals/Pain Pain Assessment Pain Assessment: Faces Faces Pain Scale: Hurts a little bit Pain Location: back, sx site Pain Descriptors / Indicators: Discomfort Pain Intervention(s): Monitored during session    Home Living Family/patient expects to be discharged to:: Private residence Living Arrangements: Children Available Help at Discharge: Family;Available PRN/intermittently Type of Home: House Home Access: Stairs to enter Entrance Stairs-Rails: Right;Left Entrance Stairs-Number of Steps: 2 in the front 8 in the back   Home Layout: One level Home Equipment: Conservation officer, nature (2 wheels);Cane - single point;Crutches;Other (comment) (oxygen) Additional Comments: pt's daughter lives with him, she is in and out visiting her boyfriend.  Prior Function Prior Level of Function : Independent/Modified Independent;Driving;History of Falls (last six months)             Mobility Comments: no AD - wears O2 "when I need it." has had 4-5 falls. ADLs Comments: indep,  drives     Hand Dominance   Dominant Hand: Right    Extremity/Trunk Assessment   Upper Extremity Assessment Upper Extremity Assessment: Defer to OT evaluation    Lower Extremity Assessment Lower Extremity Assessment: LLE deficits/detail;RLE deficits/detail RLE Deficits / Details: Strength 5/5 LLE Deficits / Details: Strength 5/5    Cervical / Trunk Assessment Cervical / Trunk Assessment: Back Surgery  Communication   Communication: No difficulties  Cognition Arousal/Alertness: Awake/alert Behavior During Therapy: WFL for tasks assessed/performed Overall Cognitive Status: Within Functional Limits for tasks assessed                                 General Comments: Overall WFL. Pt does not have great insight into his deficits        General Comments      Exercises     Assessment/Plan    PT Assessment Patient does not need any further PT services  PT Problem List         PT Treatment Interventions      PT Goals (Current goals can be found in the Care Plan section)  Acute Rehab PT Goals Patient Stated Goal: walk longer distances PT Goal Formulation: All assessment and education complete, DC therapy    Frequency       Co-evaluation               AM-PAC PT "6 Clicks" Mobility  Outcome Measure Help needed turning from your back to your side while in a flat bed without using bedrails?: None Help needed moving from lying on your back to sitting on the side of a flat bed without using bedrails?: None Help needed moving to and from a bed to a chair (including a wheelchair)?: None Help needed standing up from a chair using your arms (e.g., wheelchair or bedside chair)?: None Help needed to walk in hospital room?: None Help needed climbing 3-5 steps with a railing? : A Little 6 Click Score: 23    End of Session   Activity Tolerance: Patient tolerated treatment well Patient left: in bed;with call bell/phone within reach Nurse  Communication: Mobility status PT Visit Diagnosis: Difficulty in walking, not elsewhere classified (R26.2);Pain Pain - part of body:  (back)    Time: 6579-0383 PT Time Calculation (min) (ACUTE ONLY): 16 min   Charges:   PT Evaluation $PT Eval Low Complexity: [place], PT, DPT Acute Rehabilitation Services Pager [phone] Office [phone]   Deno Etienne May 13, 2021, 12:16 PM

## 2021-04-23 NOTE — Progress Notes (Signed)
SATURATION QUALIFICATIONS: (This note is used to comply with regulatory documentation for home oxygen)  Patient Saturations on Room Air at Rest = 88%  Patient Saturations on Room Air while Ambulating = N/A, pt < 90% on RA at rest  Patient Saturations on 4 Liters of oxygen while Ambulating = 93%  Please briefly explain why patient needs home oxygen: To maintain oxygen saturation > 90% at rest and with ambulation.  Wyona Almas, PT, DPT Acute Rehabilitation Services Pager [phone] Office [phone]

## 2021-04-23 NOTE — Discharge Summary (Signed)
Discharge Summary  Date of Admission: 04/15/2021  Date of Discharge: 05-14-21  Attending Physician: Emelda Brothers, MD  Hospital Course: Patient was admitted following an uncomplicated MIS laminectomy. They were recovered in PACU and transferred to [facility]. Their preop symptoms were completely resolved, their hospital course was uncomplicated and the patient was discharged home on 2021/05/14. They will follow up in clinic with me in clinic in 2 weeks.  Neurologic exam at discharge:  Strength 5/5 x4 and SILTx4 except bilateral stocking numbess  Discharge diagnosis: lumbar stenosis with neurogenic claudication  Judith Part, MD May 14, 2021 8:54 AM

## 2021-04-23 NOTE — Evaluation (Signed)
Occupational Therapy Evaluation Patient Details Name: Jose Keith MRN: [id number] DOB: September 19, 1950 Today's Date: 04-25-21   History of Present Illness Jose Keith is a 71 y.o. male who underswent lumbar four-Lumbar five MIS Metrx decompression 1/24. PMHx: arthritis, CAD, CHF, COPD, GER, heart attack '05,  HLD, HTN   Clinical Impression   Jose Keith reports being generally indep PTA, with a few falls due to bilat LE numbness/tinging. He lives in a 1 level home, 2-8 STE with his daughter who is in and out, but can assist as needed. Upon arrival, pt's SpO2 was resting in the 70s on RA, he required 2L O2 via nasal canula to recover to 91%, with functional activity pt dropped back down to the 80s. He was asymptomatic and required verbal cues for PLB and rest breaks. After review, he verbalized understanding of back precautions and demonstrated good ability to maintain with ADLs. Overall, he required min guard-supervision during all mobility without AD, and ADLs given verbal cues for compensatory techniques. Pt does not have further acute OT needs. Recommend d/c to home without OT follow up.      Recommendations for follow up therapy are one component of a multi-disciplinary discharge planning process, led by the attending physician.  Recommendations may be updated based on patient status, additional functional criteria and insurance authorization.   Follow Up Recommendations  No OT follow up    Assistance Recommended at Discharge Intermittent Supervision/Assistance  Patient can return home with the following A little help with walking and/or transfers;A little help with bathing/dressing/bathroom;Assist for transportation;Help with stairs or ramp for entrance    Functional Status Assessment  Patient has had a recent decline in their functional status and demonstrates the ability to make significant improvements in function in a reasonable and predictable amount of time.  Equipment  Recommendations  None recommended by OT       Precautions / Restrictions Precautions Precautions: Back;Fall Precaution Booklet Issued: Yes (comment) Precaution Comments: verbally reviewed back precautions Required Braces or Orthoses:  (no brace required per orders) Restrictions Weight Bearing Restrictions: No      Mobility Bed Mobility Overal bed mobility: Needs Assistance Bed Mobility: Rolling, Sidelying to Sit, Sit to Sidelying Rolling: Supervision Sidelying to sit: Supervision     Sit to sidelying: Supervision General bed mobility comments: verbal cues for log roll    Transfers Overall transfer level: Needs assistance Equipment used: None Transfers: Sit to/from Stand Sit to Stand: Min guard           General transfer comment: for safety only      Balance Overall balance assessment: Needs assistance Sitting-balance support: Feet supported Sitting balance-Leahy Scale: Good     Standing balance support: No upper extremity supported, During functional activity Standing balance-Leahy Scale: Fair                             ADL either performed or assessed with clinical judgement   ADL Overall ADL's : Needs assistance/impaired Eating/Feeding: Independent;Sitting   Grooming: Supervision/safety;Standing Grooming Details (indicate cue type and reason): vc's for compensatory techniques for grooming at the sink Upper Body Bathing: Set up;Sitting   Lower Body Bathing: Supervison/ safety;Sit to/from stand   Upper Body Dressing : Set up;Sitting   Lower Body Dressing: Supervision/safety;Sit to/from stand Lower Body Dressing Details (indicate cue type and reason): vc's for figure four Toilet Transfer: Min guard;Ambulation;Regular Toilet   Toileting- Clothing Manipulation and Hygiene: Supervision/safety;Sitting/lateral lean  Functional mobility during ADLs: Min guard General ADL Comments: pt demonstrated good ability to complete ADLs while  maintaing back precautiong given verbal cues. Pt limited by O2 saturations this session, required a lot of rest breaks     Vision Baseline Vision/History: 0 No visual deficits Ability to See in Adequate Light: 0 Adequate Patient Visual Report: No change from baseline Vision Assessment?: No apparent visual deficits      Pertinent Vitals/Pain Pain Assessment Pain Assessment: Faces Faces Pain Scale: Hurts a little bit Pain Location: back, sx site Pain Descriptors / Indicators: Discomfort Pain Intervention(s): Limited activity within patient's tolerance, Monitored during session     Hand Dominance Right   Extremity/Trunk Assessment Upper Extremity Assessment Upper Extremity Assessment: Overall WFL for tasks assessed   Lower Extremity Assessment Lower Extremity Assessment: Defer to PT evaluation (pt states his numbness/tingling has improved drastically but is not 100%)   Cervical / Trunk Assessment Cervical / Trunk Assessment: Back Surgery   Communication Communication Communication: No difficulties   Cognition Arousal/Alertness: Awake/alert Behavior During Therapy: WFL for tasks assessed/performed Overall Cognitive Status: Within Functional Limits for tasks assessed             General Comments: Overall WFL. Pt does not have great insight into his deficits, his O2 was resting in the 70s upon arrival, required encouragement to wear nasal canula. Verbalized back precautions after review     General Comments  SpO2 in the 70s upon arrival, required 2L [place] to recover to 90%, dipped back into 80s with funcitonal tasks - pt asymptomatic            Home Living Family/patient expects to be discharged to:: Private residence Living Arrangements: Children Available Help at Discharge: Family;Available PRN/intermittently Type of Home: House Home Access: Stairs to enter [employer] of Steps: 2 in the front 8 in the back Entrance Stairs-Rails: Right;Left Home Layout:  One level     Bathroom Shower/Tub: Occupational psychologist: Standard     Home Equipment: Conservation officer, nature (2 wheels);Cane - single point;Crutches;Other (comment) (oxygen)   Additional Comments: pt's daughter lives with him, she is in and out visiting her boyfriend.      Prior Functioning/Environment Prior Level of Function : Independent/Modified Independent;Driving;History of Falls (last six months)             Mobility Comments: no AD - wears O2 "when I need it." has had 4-5 falls. ADLs Comments: indep, drives        OT Problem List: Decreased strength;Decreased range of motion;Decreased activity tolerance;Impaired balance (sitting and/or standing);Decreased safety awareness;Decreased knowledge of precautions;Pain      OT Treatment/Interventions:      OT Goals(Current goals can be found in the care plan section) Acute Rehab OT Goals Patient Stated Goal: home OT Goal Formulation: All assessment and education complete, DC therapy  OT Frequency:         AM-PAC OT "6 Clicks" Daily Activity     Outcome Measure Help from another person eating meals?: None Help from another person taking care of personal grooming?: A Little Help from another person toileting, which includes using toliet, bedpan, or urinal?: A Little Help from another person bathing (including washing, rinsing, drying)?: A Little Help from another person to put on and taking off regular upper body clothing?: None Help from another person to put on and taking off regular lower body clothing?: A Little 6 Click Score: 20   End of Session Equipment Utilized During Treatment: Oxygen  Nurse Communication: Mobility status (O2)  Activity Tolerance: Patient tolerated treatment well Patient left: in bed;with call bell/phone within reach  OT Visit Diagnosis: Other abnormalities of gait and mobility (R26.89);Muscle weakness (generalized) (M62.81);History of falling (Z91.81);Pain                Time:  9806-9996 OT Time Calculation (min): 19 min Charges:  OT General Charges $OT Visit: 1 Visit OT Evaluation $OT Eval Low Complexity: 1 Low   Jose Keith 2021/05/02, 9:03 AM

## 2021-04-23 NOTE — TOC Transition Note (Signed)
Transition of Care [facility]) - CM/SW Discharge Note   Patient Details  Name: Jose Keith MRN: [id number] Date of Birth: [birth year]  [facility] CM/SW Contact:  Ninfa Meeker, RN Phone Number: 04/20/21, 10:30 AM   Clinical Narrative:   Case manager arranged for oxygen for home use. Referral called to [facility] with [employer]. No further needs.     Final next level of care: Home/Self Care Barriers to Discharge: No Barriers Identified   Patient Goals and CMS Choice        Discharge Placement                       Discharge Plan and Services     Post Acute Care Choice: Durable Medical Equipment          DME Arranged: Oxygen DME Agency: [employer] Date DME Agency Contacted: 04-20-2021 Time DME Agency Contacted: [phone]   HH Arranged: NA          Social Determinants of Health (SDOH) Interventions     Readmission Risk Interventions No flowsheet data found.

## 2021-04-23 NOTE — Plan of Care (Signed)
Pt given D/C instructions with verbal understanding, Rx's were sent to the pharmacy by MD. Pt's incision is clean and dry with no sign of infection. Pt's IV was removed prior to D/C. Pt received Home O2 from [employer] per MD order. Pt D/C'd home via wheelchair per MD order. Pt is stable @ D/C and has no other needs at this time. Holli Humbles, RN

## 2021-04-23 NOTE — Progress Notes (Signed)
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  -discharge home today  Judith Part  Apr 19, 2021 8:54 AM

## 2021-04-23 DEATH — deceased
